# Patient Record
Sex: Female | Born: 1995 | Race: White | Hispanic: No | Marital: Married | Smoking: Former smoker
Health system: Southern US, Community
[De-identification: ages and names within clinical notes are randomized; demographics above are authoritative.]

## PROBLEM LIST (undated history)

## (undated) DIAGNOSIS — IMO0002 Reserved for concepts with insufficient information to code with codable children: Secondary | ICD-10-CM

## (undated) DIAGNOSIS — F32A Depression, unspecified: Secondary | ICD-10-CM

## (undated) DIAGNOSIS — Z87898 Personal history of other specified conditions: Secondary | ICD-10-CM

## (undated) DIAGNOSIS — F319 Bipolar disorder, unspecified: Secondary | ICD-10-CM

## (undated) DIAGNOSIS — F4310 Post-traumatic stress disorder, unspecified: Secondary | ICD-10-CM

## (undated) DIAGNOSIS — Z915 Personal history of self-harm: Secondary | ICD-10-CM

## (undated) DIAGNOSIS — F419 Anxiety disorder, unspecified: Secondary | ICD-10-CM

## (undated) DIAGNOSIS — T7421XA Adult sexual abuse, confirmed, initial encounter: Secondary | ICD-10-CM

## (undated) DIAGNOSIS — F329 Major depressive disorder, single episode, unspecified: Secondary | ICD-10-CM

## (undated) HISTORY — DX: Anxiety disorder, unspecified: F41.9

## (undated) HISTORY — DX: Major depressive disorder, single episode, unspecified: F32.9

## (undated) HISTORY — DX: Bipolar disorder, unspecified: F31.9

## (undated) HISTORY — DX: Adult sexual abuse, confirmed, initial encounter: T74.21XA

## (undated) HISTORY — DX: Personal history of self-harm: Z91.5

## (undated) HISTORY — DX: Post-traumatic stress disorder, unspecified: F43.10

## (undated) HISTORY — DX: Personal history of other specified conditions: Z87.898

## (undated) HISTORY — DX: Reserved for concepts with insufficient information to code with codable children: IMO0002

## (undated) HISTORY — PX: ANKLE SURGERY: SHX546

## (undated) HISTORY — DX: Depression, unspecified: F32.A

---

## 2015-09-06 NOTE — L&D Delivery Note (Signed)
Delivery Note  Around 1515, FHR began having prolonged variable decels down to 70's-80's with return to baseline 120's.  Anterior lip of cervix reduced by E. Foley, RN and vtx was at +2 station.  Pt pushed effectively several times.  However, the FHR dropped to the 70's -80's without returning to baseline. Vtx at +3 station at this point.  I recommended a Vacuum and pt and family agreed. Risk of hematoma discussed.  Kiwi applied, had one pop-off and baby delivered with the next push at 1523. There was not a nuchal cord.  Weight 5lb 14oz, Cord ph 7.1 venous.  Baby was initially vigorous with good cry, fair tone.  After 1 minute, the cord was clamped and cut. 40 units of pitocin diluted in 1000cc LR was infused rapidly IV.  The placenta separated spontaneously and delivered via CCT and maternal pushing effort.  It was inspected and appears to be intact with a 3 VC. Baby became floppy at this point and was taken to the warmen.  Code Apgar called and NICU took care of the baby.    Anesthesia:  epidural Episiotomy: None Lacerations: 2nd degree, bilateral sulcus tears:  Repaired by Dr. Emelda FearFerguson Suture Repair: 2.0 vicryl Est. Blood Loss (mL):  600  Mom to postpartum.  Baby to Nursery.  Nguyen,Theresa Meece 07/28/2016, 4:27 PM

## 2016-01-21 LAB — CYTOLOGY - PAP: PAP SMEAR: ABNORMAL — AB

## 2016-02-02 LAB — OB RESULTS CONSOLE HGB/HCT, BLOOD
HEMATOCRIT: 41 %
HEMOGLOBIN: 13.6 g/dL

## 2016-02-02 LAB — OB RESULTS CONSOLE RPR: RPR: NONREACTIVE

## 2016-02-02 LAB — OB RESULTS CONSOLE ABO/RH: RH Type: POSITIVE

## 2016-02-02 LAB — OB RESULTS CONSOLE VARICELLA ZOSTER ANTIBODY, IGG: Varicella: IMMUNE

## 2016-02-02 LAB — OB RESULTS CONSOLE HIV ANTIBODY (ROUTINE TESTING): HIV: NONREACTIVE

## 2016-02-02 LAB — OB RESULTS CONSOLE PLATELET COUNT: Platelets: 250 10*3/uL

## 2016-02-02 LAB — OB RESULTS CONSOLE GC/CHLAMYDIA
CHLAMYDIA, DNA PROBE: POSITIVE
GC PROBE AMP, GENITAL: NEGATIVE

## 2016-02-02 LAB — OB RESULTS CONSOLE RUBELLA ANTIBODY, IGM: Rubella: NON-IMMUNE/NOT IMMUNE

## 2016-02-02 LAB — OB RESULTS CONSOLE HEPATITIS B SURFACE ANTIGEN: HEP B S AG: NEGATIVE

## 2016-02-02 LAB — OB RESULTS CONSOLE ANTIBODY SCREEN: ANTIBODY SCREEN: NEGATIVE

## 2016-06-01 ENCOUNTER — Encounter (HOSPITAL_COMMUNITY): Payer: Self-pay

## 2016-06-01 ENCOUNTER — Emergency Department (HOSPITAL_COMMUNITY)
Admission: EM | Admit: 2016-06-01 | Discharge: 2016-06-01 | Discharge status: Against Medical Advice | Payer: Medicaid Other | Attending: Emergency Medicine | Admitting: Emergency Medicine

## 2016-06-01 DIAGNOSIS — O26893 Other specified pregnancy related conditions, third trimester: Secondary | ICD-10-CM | POA: Diagnosis present

## 2016-06-01 DIAGNOSIS — R109 Unspecified abdominal pain: Secondary | ICD-10-CM | POA: Diagnosis not present

## 2016-06-01 DIAGNOSIS — O99333 Smoking (tobacco) complicating pregnancy, third trimester: Secondary | ICD-10-CM | POA: Diagnosis not present

## 2016-06-01 DIAGNOSIS — Z3A29 29 weeks gestation of pregnancy: Secondary | ICD-10-CM | POA: Insufficient documentation

## 2016-06-01 DIAGNOSIS — F17200 Nicotine dependence, unspecified, uncomplicated: Secondary | ICD-10-CM | POA: Insufficient documentation

## 2016-06-01 DIAGNOSIS — Z3492 Encounter for supervision of normal pregnancy, unspecified, second trimester: Secondary | ICD-10-CM

## 2016-06-01 LAB — URINALYSIS, ROUTINE W REFLEX MICROSCOPIC
BILIRUBIN URINE: NEGATIVE
GLUCOSE, UA: 100 mg/dL — AB
HGB URINE DIPSTICK: NEGATIVE
Leukocytes, UA: NEGATIVE
Nitrite: NEGATIVE
PROTEIN: NEGATIVE mg/dL
Specific Gravity, Urine: 1.02 (ref 1.005–1.030)
pH: 6.5 (ref 5.0–8.0)

## 2016-06-01 MED ORDER — GNP PRENATAL VITAMINS 28-0.8 MG PO TABS: 1.0000 | ORAL_TABLET | Freq: Every day | ORAL | 0 refills | Status: DC

## 2016-06-01 MED ORDER — SODIUM CHLORIDE 0.9 % IV BOLUS (SEPSIS): 1000.0000 mL | Freq: Once | INTRAVENOUS | Status: DC

## 2016-06-01 NOTE — ED Notes (Signed)
Pt given oral fluids and is tolerating well

## 2016-06-01 NOTE — ED Notes (Signed)
Pt awake and alert.  Talking and laughing on cell phone.

## 2016-06-01 NOTE — ED Provider Notes (Signed)
[facility] DEPT Provider Note   CSN: [id number] Arrival date & time: 06/01/16  1937     History   Chief Complaint Chief Complaint  Patient presents with  . Abdominal Pain    HPI Theresa Nguyen is a 20 y.o. female.  Pt is approximately [redacted] weeks pregnant and presents to the ED with abdominal pain and cramping and a fluid leak around 1630.  The pt had to move mid-pregnancy and has not had any prenatal care since July.  The pt said the abdominal cramping has increased since the fluid leaked.  Pt denies any vaginal bleeding.      History reviewed. No pertinent past medical history.  There are no active problems to display for this patient.   Past Surgical History:  Procedure Laterality Date  . ANKLE SURGERY      OB History    Gravida Para Term Preterm AB Living   1             SAB TAB Ectopic Multiple Live Births                   Home Medications    Prior to Admission medications   Medication Sig Start Date End Date Taking? Authorizing Provider  Prenatal Vit-Fe Fumarate-FA (GNP PRENATAL VITAMINS) 28-0.8 MG TABS Take 1 tablet by mouth daily. 06/01/16   Jacalyn LefevreJulie Arihanna Estabrook, MD    Family History No family history on file.  Social History Social History  Substance Use Topics  . Smoking status: Current Every Day Smoker    Packs/day: 0.50  . Smokeless tobacco: Never Used  . Alcohol use No     Allergies   Review of patient's allergies indicates no known allergies.   Review of Systems Review of Systems  Gastrointestinal: Positive for abdominal pain.  All other systems reviewed and are negative.    Physical Exam Updated Vital Signs BP 122/84   Pulse 102   Temp 98.9 F (37.2 C) (Oral)   Resp 24   Ht 5\' 2"  (1.575 m)   Wt 164 lb 8 oz (74.6 kg)   SpO2 100%   BMI 30.09 kg/m   Physical Exam  Constitutional: She appears well-developed and well-nourished.  HENT:  Head: Normocephalic and atraumatic.  Right Ear: External ear normal.  Left Ear: External  ear normal.  Nose: Nose normal.  Mouth/Throat: Oropharynx is clear and moist.  Eyes: Conjunctivae and EOM are normal. Pupils are equal, round, and reactive to light.  Neck: Normal range of motion. Neck supple.  Cardiovascular: Normal rate, regular rhythm, normal heart sounds and intact distal pulses.   Pulmonary/Chest: Effort normal and breath sounds normal.  Abdominal: Soft. Bowel sounds are normal.  Pt is gravid  Musculoskeletal: Normal range of motion.  Neurological: She is alert.  Skin: Skin is warm.  Psychiatric: She has a normal mood and affect. Her behavior is normal. Judgment and thought content normal.  Nursing note and vitals reviewed.    ED Treatments / Results  Labs (all labs ordered are listed, but only abnormal results are displayed) Labs Reviewed  URINALYSIS, ROUTINE W REFLEX MICROSCOPIC (NOT AT [facility])    EKG  EKG Interpretation None       Radiology No results found.  Procedures Procedures (including critical care time)  Medications Ordered in ED Medications - No data to display   Initial Impression / Assessment and Plan / ED Course  I have reviewed the triage vital signs and the nursing notes.  Pertinent labs & imaging  results that were available during my care of the patient were reviewed by me and considered in my medical decision making (see chart for details).  Clinical Course   Nitrazine paper test pH of 5.  Rapid OB response called and pt hooked to the fetal monitor.  The OB folks said the tracings were reassuring and pt was not in labor.  Pt is feeling better.  She is able to tolerate po fluids.  Final Clinical Impressions(s) / ED Diagnoses   Final diagnoses:  Second trimester pregnancy    New Prescriptions New Prescriptions   PRENATAL VIT-FE FUMARATE-FA (GNP PRENATAL VITAMINS) 28-0.8 MG TABS    Take 1 tablet by mouth daily.     Jacalyn Lefevre, MD 06/01/16 2215

## 2016-06-01 NOTE — ED Notes (Addendum)
Pt observed walking down the hallway fully dressed.  Pt states she has to leave that her friend with her has to be at work in the morning at 0300.  Pt refused to wait for discharge papers.  Ed md notified

## 2016-06-01 NOTE — ED Provider Notes (Signed)
.   Please see previous physicians note regarding patient's presenting history and physical, initial ED course, and associated medical decision making. Pending UA at time of sign out. UA is negative for bacteria or infection. Cleared from contractions by OB earlier. Was felt to be stable for discharge if UA unermarkable. Patient eloped prior to resulting of UA.     Theresa Nguyen Arthor Gorter, MD 06/01/16 [id number]

## 2016-06-01 NOTE — ED Notes (Addendum)
[facility]  Pt removed from monitor at this time

## 2016-06-01 NOTE — ED Triage Notes (Signed)
Reports of abdominal cramping that started last night. States she was sitting on commode approx. 1630 and had gush of fluid leak. Patient states cramping in stomach has increased since fluid leaking. Approx. [redacted] weeks pregnant.

## 2016-06-01 NOTE — ED Notes (Signed)
[facility], she states baby and mother look good on the monitor.  Informed ed md

## 2016-06-17 ENCOUNTER — Other Ambulatory Visit (HOSPITAL_COMMUNITY)
Admission: RE | Admit: 2016-06-17 | Discharge: 2016-06-17 | Disposition: A | Discharge status: Home / Self Care | Payer: Medicaid Other | Source: Ambulatory Visit | Attending: Obstetrics and Gynecology | Admitting: Obstetrics and Gynecology

## 2016-06-17 ENCOUNTER — Encounter: Payer: Self-pay | Admitting: Obstetrics and Gynecology

## 2016-06-17 ENCOUNTER — Encounter: Payer: Self-pay | Admitting: *Deleted

## 2016-06-17 ENCOUNTER — Ambulatory Visit (INDEPENDENT_AMBULATORY_CARE_PROVIDER_SITE_OTHER): Payer: Medicaid Other | Admitting: Obstetrics and Gynecology

## 2016-06-17 VITALS — BP 118/87 | HR 96 | Wt 162.0 lb

## 2016-06-17 DIAGNOSIS — F319 Bipolar disorder, unspecified: Secondary | ICD-10-CM | POA: Insufficient documentation

## 2016-06-17 DIAGNOSIS — Z8619 Personal history of other infectious and parasitic diseases: Secondary | ICD-10-CM | POA: Insufficient documentation

## 2016-06-17 DIAGNOSIS — R87610 Atypical squamous cells of undetermined significance on cytologic smear of cervix (ASC-US): Secondary | ICD-10-CM | POA: Insufficient documentation

## 2016-06-17 DIAGNOSIS — Q249 Congenital malformation of heart, unspecified: Secondary | ICD-10-CM | POA: Insufficient documentation

## 2016-06-17 DIAGNOSIS — O0993 Supervision of high risk pregnancy, unspecified, third trimester: Secondary | ICD-10-CM

## 2016-06-17 DIAGNOSIS — O0990 Supervision of high risk pregnancy, unspecified, unspecified trimester: Secondary | ICD-10-CM | POA: Insufficient documentation

## 2016-06-17 DIAGNOSIS — Z113 Encounter for screening for infections with a predominantly sexual mode of transmission: Secondary | ICD-10-CM

## 2016-06-17 DIAGNOSIS — Z87440 Personal history of urinary (tract) infections: Secondary | ICD-10-CM | POA: Insufficient documentation

## 2016-06-17 DIAGNOSIS — F3189 Other bipolar disorder: Secondary | ICD-10-CM

## 2016-06-17 DIAGNOSIS — Z23 Encounter for immunization: Secondary | ICD-10-CM

## 2016-06-17 DIAGNOSIS — F329 Major depressive disorder, single episode, unspecified: Secondary | ICD-10-CM

## 2016-06-17 DIAGNOSIS — O99343 Other mental disorders complicating pregnancy, third trimester: Secondary | ICD-10-CM

## 2016-06-17 DIAGNOSIS — IMO0002 Reserved for concepts with insufficient information to code with codable children: Secondary | ICD-10-CM | POA: Insufficient documentation

## 2016-06-17 DIAGNOSIS — Z915 Personal history of self-harm: Secondary | ICD-10-CM

## 2016-06-17 DIAGNOSIS — T7421XS Adult sexual abuse, confirmed, sequela: Secondary | ICD-10-CM

## 2016-06-17 DIAGNOSIS — Z87898 Personal history of other specified conditions: Secondary | ICD-10-CM | POA: Insufficient documentation

## 2016-06-17 DIAGNOSIS — F32A Depression, unspecified: Secondary | ICD-10-CM

## 2016-06-17 DIAGNOSIS — F419 Anxiety disorder, unspecified: Secondary | ICD-10-CM

## 2016-06-17 DIAGNOSIS — R87810 Cervical high risk human papillomavirus (HPV) DNA test positive: Secondary | ICD-10-CM

## 2016-06-17 DIAGNOSIS — T7421XA Adult sexual abuse, confirmed, initial encounter: Secondary | ICD-10-CM | POA: Insufficient documentation

## 2016-06-17 LAB — TETRALOGY OF FALLOT REPAIR: AFP: NEGATIVE
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
## 2016-06-18 LAB — PAIN MGMT, PROFILE 6 CONF W/O MM, U
6 ACETYLMORPHINE: NEGATIVE ng/mL (ref <10)
AMPHETAMINES: NEGATIVE ng/mL (ref <500)
Alcohol Metabolites: NEGATIVE ng/mL (ref <500)
BARBITURATES: NEGATIVE ng/mL (ref <300)
Benzodiazepines: NEGATIVE ng/mL (ref <100)
CREATININE: 125.7 mg/dL (ref >=20.0)
Cocaine Metabolite: NEGATIVE ng/mL (ref <150)
Marijuana Metabolite: NEGATIVE ng/mL (ref <20)
Methadone Metabolite: NEGATIVE ng/mL (ref <100)
Opiates: NEGATIVE ng/mL (ref <100)
Oxidant: NEGATIVE ug/mL (ref <200)
Oxycodone: NEGATIVE ng/mL (ref <100)
PH: 7.52 (ref 4.5–9.0)
Phencyclidine: NEGATIVE ng/mL (ref <25)
Please note:: 0

## 2016-06-18 LAB — CBC
HCT: 37 % (ref 35.0–45.0)
Hemoglobin: 12 g/dL (ref 11.7–15.5)
MCH: 27.8 pg (ref 27.0–33.0)
MCHC: 32.4 g/dL (ref 32.0–36.0)
MCV: 85.8 fL (ref 80.0–100.0)
MPV: 10.7 fL (ref 7.5–12.5)
Platelets: 251 10*3/uL (ref 140–400)
RBC: 4.31 MIL/uL (ref 3.80–5.10)
RDW: 12.3 % (ref 11.0–15.0)
WBC: 11.4 10*3/uL — AB (ref 3.8–10.8)

## 2016-06-18 LAB — RPR

## 2016-06-18 LAB — HEPATITIS C ANTIBODY: HCV AB: NEGATIVE

## 2016-06-18 LAB — GLUCOSE TOLERANCE, 1 HOUR (50G) W/O FASTING: GLUCOSE, 1 HR, GESTATIONAL: 82 mg/dL (ref <140)

## 2016-06-18 LAB — HIV ANTIBODY (ROUTINE TESTING W REFLEX): HIV 1&2 Ab, 4th Generation: NONREACTIVE

## 2016-06-19 LAB — URINE CULTURE

## 2016-06-20 ENCOUNTER — Encounter: Payer: Self-pay | Admitting: *Deleted

## 2016-06-20 LAB — GC/CHLAMYDIA PROBE AMP (~~LOC~~) NOT AT ARMC
CHLAMYDIA, DNA PROBE: NEGATIVE
NEISSERIA GONORRHEA: NEGATIVE

## 2016-06-21 ENCOUNTER — Telehealth: Payer: Self-pay | Admitting: *Deleted

## 2016-06-21 NOTE — Telephone Encounter (Signed)
-----   Message from Olevia BowensJacinda S Battle sent at 06/21/2016 10:17 AM EDT ----- Regarding: Preg Care Manager [facility] In[facility]anika Scales  Pregnancy Care Manager [phone] wants to speak with you regarding this patient

## 2016-06-21 NOTE — Telephone Encounter (Signed)
Returned call, left message. 

## 2016-06-22 ENCOUNTER — Encounter (HOSPITAL_COMMUNITY): Payer: Self-pay | Admitting: Obstetrics and Gynecology

## 2016-07-01 ENCOUNTER — Ambulatory Visit (HOSPITAL_COMMUNITY): Payer: Medicaid Other

## 2016-07-01 ENCOUNTER — Encounter: Payer: Medicaid Other | Admitting: Obstetrics and Gynecology

## 2016-07-08 ENCOUNTER — Ambulatory Visit (HOSPITAL_COMMUNITY)
Admission: RE | Admit: 2016-07-08 | Discharge: 2016-07-08 | Disposition: A | Discharge status: Home / Self Care | Payer: Medicaid Other | Source: Ambulatory Visit | Attending: Obstetrics and Gynecology | Admitting: Obstetrics and Gynecology

## 2016-07-08 DIAGNOSIS — O0933 Supervision of pregnancy with insufficient antenatal care, third trimester: Secondary | ICD-10-CM | POA: Diagnosis not present

## 2016-07-08 DIAGNOSIS — O0993 Supervision of high risk pregnancy, unspecified, third trimester: Secondary | ICD-10-CM

## 2016-07-08 DIAGNOSIS — Z363 Encounter for antenatal screening for malformations: Secondary | ICD-10-CM | POA: Insufficient documentation

## 2016-07-08 DIAGNOSIS — Z3A34 34 weeks gestation of pregnancy: Secondary | ICD-10-CM | POA: Diagnosis not present

## 2016-07-08 DIAGNOSIS — Z87898 Personal history of other specified conditions: Secondary | ICD-10-CM

## 2016-07-15 ENCOUNTER — Encounter: Payer: Medicaid Other | Admitting: Family Medicine

## 2016-07-20 ENCOUNTER — Other Ambulatory Visit (HOSPITAL_COMMUNITY)
Admission: RE | Admit: 2016-07-20 | Discharge: 2016-07-20 | Disposition: A | Discharge status: Home / Self Care | Payer: Medicaid Other | Source: Ambulatory Visit | Attending: Family Medicine | Admitting: Family Medicine

## 2016-07-20 ENCOUNTER — Ambulatory Visit (INDEPENDENT_AMBULATORY_CARE_PROVIDER_SITE_OTHER): Payer: Medicaid Other | Admitting: Family Medicine

## 2016-07-20 VITALS — BP 142/97 | Wt 168.0 lb

## 2016-07-20 DIAGNOSIS — R87810 Cervical high risk human papillomavirus (HPV) DNA test positive: Secondary | ICD-10-CM

## 2016-07-20 DIAGNOSIS — F3189 Other bipolar disorder: Secondary | ICD-10-CM | POA: Diagnosis not present

## 2016-07-20 DIAGNOSIS — O133 Gestational [pregnancy-induced] hypertension without significant proteinuria, third trimester: Secondary | ICD-10-CM

## 2016-07-20 DIAGNOSIS — R87610 Atypical squamous cells of undetermined significance on cytologic smear of cervix (ASC-US): Secondary | ICD-10-CM

## 2016-07-20 DIAGNOSIS — O0993 Supervision of high risk pregnancy, unspecified, third trimester: Secondary | ICD-10-CM

## 2016-07-20 DIAGNOSIS — O99343 Other mental disorders complicating pregnancy, third trimester: Secondary | ICD-10-CM

## 2016-07-20 DIAGNOSIS — F329 Major depressive disorder, single episode, unspecified: Secondary | ICD-10-CM | POA: Diagnosis not present

## 2016-07-20 DIAGNOSIS — O163 Unspecified maternal hypertension, third trimester: Secondary | ICD-10-CM

## 2016-07-20 DIAGNOSIS — Z113 Encounter for screening for infections with a predominantly sexual mode of transmission: Secondary | ICD-10-CM | POA: Diagnosis not present

## 2016-07-20 DIAGNOSIS — Q249 Congenital malformation of heart, unspecified: Secondary | ICD-10-CM

## 2016-07-20 DIAGNOSIS — F319 Bipolar disorder, unspecified: Secondary | ICD-10-CM

## 2016-07-20 DIAGNOSIS — F32A Depression, unspecified: Secondary | ICD-10-CM

## 2016-07-20 LAB — COMPREHENSIVE METABOLIC PANEL
ALT: 7 U/L (ref 6–29)
AST: 13 U/L (ref 10–30)
Albumin: 3.5 g/dL — ABNORMAL LOW (ref 3.6–5.1)
Alkaline Phosphatase: 148 U/L — ABNORMAL HIGH (ref 33–115)
BUN: 7 mg/dL (ref 7–25)
CALCIUM: 8.8 mg/dL (ref 8.6–10.2)
CHLORIDE: 105 mmol/L (ref 98–110)
CO2: 19 mmol/L — AB (ref 20–31)
Creat: 0.47 mg/dL — ABNORMAL LOW (ref 0.50–1.10)
GLUCOSE: 97 mg/dL (ref 65–99)
POTASSIUM: 4.2 mmol/L (ref 3.5–5.3)
Sodium: 135 mmol/L (ref 135–146)
Total Bilirubin: 0.3 mg/dL (ref 0.2–1.2)
Total Protein: 6 g/dL — ABNORMAL LOW (ref 6.1–8.1)

## 2016-07-20 LAB — CBC
HCT: 35.4 % (ref 35.0–45.0)
HEMOGLOBIN: 11.8 g/dL (ref 11.7–15.5)
MCH: 26.9 pg — AB (ref 27.0–33.0)
MCHC: 33.3 g/dL (ref 32.0–36.0)
MCV: 80.6 fL (ref 80.0–100.0)
MPV: 11.7 fL (ref 7.5–12.5)
Platelets: 229 10*3/uL (ref 140–400)
RBC: 4.39 MIL/uL (ref 3.80–5.10)
RDW: 12.8 % (ref 11.0–15.0)
WBC: 9 10*3/uL (ref 3.8–10.8)

## 2016-07-20 LAB — OB RESULTS CONSOLE GC/CHLAMYDIA: Gonorrhea: NEGATIVE

## 2016-07-20 LAB — OB RESULTS CONSOLE GBS: GBS: NEGATIVE

## 2016-07-20 MED ORDER — SERTRALINE HCL 50 MG PO TABS: 50.0000 mg | ORAL_TABLET | Freq: Every day | ORAL | 2 refills | Status: DC

## 2016-07-20 NOTE — Patient Instructions (Signed)
Exclusive Breastfeeding Deciding to breastfeed is one of the best choices you can make for you and your baby. Breast milk has all the nutrients that your baby needs, when your baby needs them. That is why many women who can choose to breastfeed exclusively. A change in hormones during pregnancy causes your breast tissue to grow and increases the number and size of your milk ducts. These hormones also allow proteins, sugars, and fats from your blood supply to make breast milk in your milk-producing glands. Hormones prevent breast milk from being released before your baby is born as well as prompt milk flow after birth. Once breastfeeding has begun, thoughts of your baby, as well as his or her sucking or crying, can stimulate the release of milk from your milk-producing glands.  BENEFITS OF BREASTFEEDING For Your Baby   Your first milk (colostrum) helps your baby's digestive system function better.  There are antibodies in your milk that help your baby fight off infections.  Your baby has a lower incidence of asthma, allergies, and sudden infant death syndrome.  The nutrients in breast milk are better for your baby than infant formulas and are designed uniquely for your baby's needs.  Breast milk improves your baby's brain development.  Your baby is less likely to develop other conditions, such as childhood obesity, asthma, or type 2 diabetes mellitus. For You   Breastfeeding helps to create a very special bond between you and your baby.  Breastfeeding is convenient. Breast milk is always available at the correct temperature and costs nothing.  Breastfeeding helps to burn calories and helps you lose the weight gained during pregnancy.  Breastfeeding makes your uterus contract to its prepregnancy size faster and slows bleeding (lochia) after you give birth.  Breastfeeding helps to lower your risk of developing type 2 diabetes mellitus, osteoporosis, and breast or ovarian cancer later in  life. BENEFITS OF EXCLUSIVE BREASTFEEDING  The more often your baby breastfeeds, the more milk you will produce. By exclusively breastfeeding, you help to ensure that your baby has an ample supply of milk. Supplementing with water or infant formula when your baby would otherwise be breastfeeding may send a message to your body that less milk is needed. Your baby may become full from the supplements and breastfeed less. This can interfere with the establishment of your milk supply.  Exclusive breastfeeding helps to develop your baby's immune system in a healthy way. Your breast milk contains antibodies your baby needs to to fight off germs that cause infections. Breastfed babies are also less likely to develop food allergies. When your baby is introduced to foods other than breast milk too soon, his or her body is more likely to interpret that food as an allergen, making antibodies against it. This is what causes food allergies. If your baby breastfeeds less because he or she is also receiving supplements, he or she may have a harder time fighting infections or be more likely to develop food allergies. SIGNS THAT YOUR BABY IS HUNGRY Early Signs of Hunger  Increased alertness or activity.  Stretching.  Movement of the head from side to side.  Movement of the head and opening of the mouth when the corner of the mouth or cheek is stroked (rooting).  Increased sucking sounds, smacking lips, cooing, sighing, or squeaking.  Hand-to-mouth movements.  Increased sucking of fingers or hands. Late Signs of Hunger   Fussing.  Intermittent crying. Extreme Signs of Hunger  Signs of extreme hunger will require calming and  consoling before your baby will be able to breastfeed successfully. Do not wait for the following signs of extreme hunger to occur before you initiate breastfeeding:   Restlessness.  A loud, strong cry.  Screaming. BREASTFEEDING BASICS Breastfeeding Initiation   Find a  comfortable place to sit or lie down, with your neck and back well supported.  Place a pillow or rolled up blanket under your baby to bring him or her to the level of your breast (if you are seated). Nursing pillows are specially designed to help support your arms and your baby while you breastfeed.  Make sure that your baby's abdomen is facing your abdomen.  Gently massage your breast. With your fingertips, massage from your chest wall toward your nipple in a circular motion. This encourages milk flow. You may need to continue this action during the feeding if your milk flows slowly.  Support your breast with 4 fingers underneath and your thumb above your nipple. Make sure your fingers are well away from your nipple and your baby's mouth.  Stroke your baby's lips gently with your finger or nipple.  When your baby's mouth is open wide enough, quickly bring your baby to your breast, placing your entire nipple and as much of the colored area around your nipple (areola) as possible into your baby's mouth.  More areola should be visible above your baby's upper lip than below the lower lip.  Your baby's tongue should be between his or her lower gum and your breast.  Ensure that your baby's mouth is correctly positioned around your nipple (latched). Your baby's lips should create a seal on your breast and be turned out (everted).  It is common for your baby to suck for about 2-3 minutes in order to start the flow of breast milk. Latching  Teaching your baby how to latch on to your breast properly is very important. An improper latch can cause nipple pain and decreased milk supply for you and poor weight gain in your baby. Also, if your baby is not latched onto your nipple properly, he or she may swallow some air during feeding. This can make your baby fussy. Burping your baby when you switch breasts during the feeding can help to get rid of the air. However, teaching your baby to latch on properly is  still the best way to prevent your baby from swallowing air while breastfeeding. Signs that your baby has successfully latched on to your nipple:   Silent tugging or silent sucking, without causing you pain.  Swallowing heard between every 3-4 sucks.  Muscle movement above and in front of his or her ears while sucking. Signs that your baby has not successfully latched on to your nipple:   Sucking sounds or smacking sounds from your baby while breastfeeding.  Nipple pain. If you think your baby has not latched on correctly, slip your finger into the corner of your baby's mouth to break the suction and place it between your baby's gums. Attempt breastfeeding initiation again. Signs of Successful Breastfeeding  Signs from your baby:   A gradual decrease in the number of sucks or complete cessation of sucking.  Falling asleep.  Relaxation of his or her body.  Retention of a small amount of milk in his or her mouth.  Letting go of your breast by himself or herself. Signs from you:  Breasts that have increased in firmness, weight, and size 1-3 hours after feeding.  Breasts that are softer immediately after breastfeeding.  Increased  milk volume, as well as a change in milk consistency and color by the 5th day of breastfeeding.  Nipples that are not sore, cracked, or bleeding. Signs That Your Pecola LeisureBaby is Getting Enough Milk   Wetting at least 3 diapers in a 24-hour period. The urine should be clear and pale yellow by age 37 days.  At least 3 stools in a 24-hour period by age 37 days. The stool should be soft and yellow.  At least 3 stools in a 24-hour period by age 68 days. The stool should be seedy and yellow.  No loss of weight greater than 10% of birth weight during the first 483 days of age.  Average weight gain of 4-7 ounces (120-210 mL) per week after age 75 days.  Consistent daily weight gain by age 37 days, without weight loss after the age of 2 weeks. After a feeding, your baby  may spit up a small amount. This is common. BREASTFEEDING FREQUENCY AND DURATION Frequent feeding will help you make more milk and can prevent sore nipples and breast engorgement. Breastfeed when you feel the need to reduce the fullness of your breasts or when your baby shows signs of hunger. This is called "breastfeeding on demand." Avoid introducing a pacifier to your baby while you are working to establish breastfeeding (the first 4-6 weeks after your baby is born). After this time you may choose to use a pacifier. Research has shown that pacifier use during the first year of a baby's life decreases the risk of sudden infant death syndrome (SIDS). Allow your baby to feed on each breast as long as he or she wants. Breastfeed until your baby is finished feeding. When your baby unlatches or falls asleep while feeding from the first breast, offer the second breast. Because newborns are often sleepy in the first few weeks of life, you may need to awaken your baby to get him or her to feed. Breastfeeding times will vary from baby to baby. However, the following rules can serve as a guide to help you ensure that your baby is properly fed:  Newborns (babies 84 weeks of age or younger) may breastfeed every 1-3 hours.  Newborns should not go longer than 3 hours during the day or 5 hours during the night without breastfeeding.  You should breastfeed your baby a minimum of 8 times in a 24-hour period until you begin to introduce solid foods to your baby at around 746 months of age. BREAST MILK PUMPING Pumping and storing breast milk allows you to ensure that your baby is exclusively fed your breast milk, even at times when you are unable to breastfeed. This is especially important if you are going back to work while you are still breastfeeding or when you are not able to be present during feedings. Your lactation consultant can give you guidelines on how long it is safe to store breast milk.  A breast pump is a  machine that allows you to pump milk from your breast into a sterile bottle. The pumped breast milk can then be stored in a refrigerator or freezer. Some breast pumps are operated by hand, while others use electricity. Ask your lactation consultant which type will work best for you. Breast pumps can be purchased, but some hospitals and breastfeeding support groups lease breast pumps on a monthly basis. A lactation consultant can teach you how to hand express breast milk, if you prefer not to use a pump.  CARING FOR YOUR BREASTS WHILE YOU  BREASTFEED Nipples can become dry, cracked, and sore while breastfeeding. The following recommendations can help keep your breasts moisturized and healthy:  Avoid using soap on your nipples.  Wear a supportive bra. Although not required, special nursing bras and tank tops are designed to allow access to your breasts for breastfeeding without taking off your entire bra or top. Avoid wearing underwire style bras or extremely tight bras.  Air dry your nipples for 3-4 minutes after each feeding.  Use only cotton bra pads to absorb leaked breast milk. Leaking of breast milk between feedings is normal.  Use lanolin on your nipples after breastfeeding. Lanolin helps to maintain your skin's normal moisture barrier. If you use pure lanolin you do not need to wash it off before feeding your baby again. Pure lanolin is not toxic to your baby. You may also hand express a few drops of breast milk and gently massage that milk into your nipples and allow the milk to air dry. In the first few weeks after giving birth, some women experience extremely full breasts (engorgement). Engorgement can make your breasts feel heavy, warm, and tender to the touch. Engorgement peaks within 3-5 days after you give birth. The following recommendations can help ease engorgement:  Completely empty your breasts while breastfeeding or pumping. You may want to start by applying warm, moist heat (in the  shower or with warm water-soaked hand towels) just before feeding or pumping. This increases circulation and helps the milk flow. If your baby does not completely empty your breasts while breastfeeding, pump any extra milk after he or she is finished.  Wear a snug bra (nursing or regular) or tank top for 1-2 days to signal your body to slightly decrease milk production.  Apply ice packs to your breasts, unless this is too uncomfortable for you.  Make sure that your baby is latched on and positioned properly while breastfeeding. If engorgement persists after 48 hours of following these recommendations, contact your health care provider or a Advertising copywriter. OVERALL HEALTH CARE RECOMMENDATIONS WHILE BREASTFEEDING   Eat healthy foods. Alternate between meals and snacks, eating 3 of each per day. Because what you eat affects your breast milk, some of the foods may make your baby more irritable than usual. Avoid eating these foods if you are sure that they are negatively affecting your baby.  Drink milk, fruit juice, and water to satisfy your thirst (about 10 glasses a day).  Rest often, relax, and continue to take your prenatal vitamins to prevent fatigue, stress, and anemia.  Continue breast self-awareness checks.  Avoid chewing and smoking tobacco.  Avoid alcohol and drug use. Some medicines that may be harmful to your baby can pass through breast milk. It is important to ask your health care provider before taking any medicine, including all over-the-counter and prescription medicine as well as vitamin and herbal supplements. It is possible to become pregnant while breastfeeding. If birth control is desired, ask your health care provider about options that will be safe for your baby. SEEK MEDICAL CARE IF:   You feel like you want to stop breastfeeding or have become frustrated with breastfeeding.  You have painful breasts or nipples.  Your nipples are cracked or bleeding.  Your  breasts are red, tender, or warm.  You have a swollen area on either breast.  You have a fever or chills.  You have nausea or vomiting.  You have drainage other than breast milk from your nipples.  Your breasts do not  become full before feedings by the 5th day after you give birth.  You feel sad and depressed.  Your baby is too sleepy to eat well.  Your baby is having trouble sleeping.  Your baby is wetting less than 3 diapers in a 24-hour period.  Your baby has less than 3 stools in a 24-hour period.  Your baby's skin or the white part of his or her eyes becomes yellow.  Your baby is not gaining weight by 22 days of age. SEEK IMMEDIATE MEDICAL CARE IF:   Your baby is overly tired (lethargic) and does not want to wake up and feed.  Your baby develops an unexplained fever. This information is not intended to replace advice given to you by your health care provider. Make sure you discuss any questions you have with your health care provider. Document Released: 06/18/2009 Document Revised: 12/14/2015 Document Reviewed: 02/14/2013 Elsevier Interactive Patient Education  2017 [employer].
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
## 2016-07-20 NOTE — Progress Notes (Signed)
Pt states about a week ago she has been leaking fluid and has increased pain with irregular contractions. She states she has started taking Zoloft that was given to her by previous OB because depression is worsening and hears voices in her head. She states she needs a refill today on Zoloft.

## 2016-07-21 ENCOUNTER — Ambulatory Visit: Payer: Medicaid Other | Admitting: *Deleted

## 2016-07-21 ENCOUNTER — Encounter (HOSPITAL_COMMUNITY): Payer: Self-pay | Admitting: *Deleted

## 2016-07-21 ENCOUNTER — Telehealth (HOSPITAL_COMMUNITY): Payer: Self-pay | Admitting: *Deleted

## 2016-07-21 ENCOUNTER — Encounter: Payer: Self-pay | Admitting: *Deleted

## 2016-07-21 VITALS — BP 144/91 | HR 94

## 2016-07-21 DIAGNOSIS — O0993 Supervision of high risk pregnancy, unspecified, third trimester: Secondary | ICD-10-CM

## 2016-07-21 LAB — PROTEIN / CREATININE RATIO, URINE
CREATININE, URINE: 88 mg/dL (ref 20–320)
Protein Creatinine Ratio: 205 mg/g creat — ABNORMAL HIGH (ref 21–161)
TOTAL PROTEIN, URINE: 18 mg/dL (ref 5–24)

## 2016-07-21 NOTE — Telephone Encounter (Signed)
Preadmission screen  

## 2016-07-21 NOTE — Progress Notes (Signed)
Pt is here for BP check only.  She was here yesterday and seen by Dr Alvester MorinNewton and who had her to return today for BP check.  Todays BP is 144/91 and per yesterday's note if continued to be elevated today she is to be scheduled for IOL @ 37 weeks.  IOL scheduled for 07/25/16 @ 7AM  Form mailed and confirmed by Herbert SetaHeather on L and D.

## 2016-07-22 LAB — CULTURE, BETA STREP (GROUP B ONLY)

## 2016-07-22 LAB — URINE CYTOLOGY ANCILLARY ONLY
Chlamydia: NEGATIVE
Neisseria Gonorrhea: NEGATIVE

## 2016-07-26 MED ORDER — FENTANYL CITRATE (PF) 100 MCG/2ML IJ SOLN: 100.0000 ug | INTRAMUSCULAR | Status: DC | PRN

## 2016-07-27 ENCOUNTER — Encounter (HOSPITAL_COMMUNITY): Payer: Self-pay

## 2016-07-27 ENCOUNTER — Inpatient Hospital Stay (HOSPITAL_COMMUNITY): Payer: Medicaid Other | Admitting: Anesthesiology

## 2016-07-27 ENCOUNTER — Inpatient Hospital Stay (HOSPITAL_COMMUNITY)
Admission: RE | Admit: 2016-07-27 | Discharge: 2016-07-30 | DRG: 775 | Disposition: A | Discharge status: Home / Self Care | Payer: Medicaid Other | Source: Ambulatory Visit | Attending: Family Medicine | Admitting: Family Medicine

## 2016-07-27 DIAGNOSIS — F319 Bipolar disorder, unspecified: Secondary | ICD-10-CM

## 2016-07-27 DIAGNOSIS — F32A Depression, unspecified: Secondary | ICD-10-CM

## 2016-07-27 DIAGNOSIS — F329 Major depressive disorder, single episode, unspecified: Secondary | ICD-10-CM

## 2016-07-27 DIAGNOSIS — Z8249 Family history of ischemic heart disease and other diseases of the circulatory system: Secondary | ICD-10-CM

## 2016-07-27 DIAGNOSIS — Z87891 Personal history of nicotine dependence: Secondary | ICD-10-CM

## 2016-07-27 DIAGNOSIS — Z3A37 37 weeks gestation of pregnancy: Secondary | ICD-10-CM | POA: Diagnosis not present

## 2016-07-27 DIAGNOSIS — O139 Gestational [pregnancy-induced] hypertension without significant proteinuria, unspecified trimester: Secondary | ICD-10-CM | POA: Diagnosis present

## 2016-07-27 DIAGNOSIS — O134 Gestational [pregnancy-induced] hypertension without significant proteinuria, complicating childbirth: Secondary | ICD-10-CM | POA: Diagnosis not present

## 2016-07-27 LAB — COMPREHENSIVE METABOLIC PANEL
ALBUMIN: 2.9 g/dL — AB (ref 3.5–5.0)
ALK PHOS: 151 U/L — AB (ref 38–126)
ALT: 11 U/L — ABNORMAL LOW (ref 14–54)
AST: 24 U/L (ref 15–41)
Anion gap: 5 (ref 5–15)
BILIRUBIN TOTAL: 0.7 mg/dL (ref 0.3–1.2)
BUN: 7 mg/dL (ref 6–20)
CALCIUM: 8.7 mg/dL — AB (ref 8.9–10.3)
CO2: 19 mmol/L — AB (ref 22–32)
Chloride: 109 mmol/L (ref 101–111)
Creatinine, Ser: 0.47 mg/dL (ref 0.44–1.00)
GFR calc Af Amer: >60 mL/min (ref >60)
GFR calc non Af Amer: >60 mL/min (ref >60)
GLUCOSE: 91 mg/dL (ref 65–99)
POTASSIUM: 4 mmol/L (ref 3.5–5.1)
SODIUM: 133 mmol/L — AB (ref 135–145)
TOTAL PROTEIN: 6.2 g/dL — AB (ref 6.5–8.1)

## 2016-07-27 LAB — RAPID URINE DRUG SCREEN, HOSP PERFORMED
Amphetamines: NOT DETECTED
BENZODIAZEPINES: NOT DETECTED
Barbiturates: NOT DETECTED
COCAINE: NOT DETECTED
Opiates: NOT DETECTED
Tetrahydrocannabinol: NOT DETECTED

## 2016-07-27 LAB — CBC
HEMATOCRIT: 31.6 % — AB (ref 36.0–46.0)
HEMOGLOBIN: 10.5 g/dL — AB (ref 12.0–15.0)
MCH: 26.6 pg (ref 26.0–34.0)
MCHC: 33.2 g/dL (ref 30.0–36.0)
MCV: 80.2 fL (ref 78.0–100.0)
Platelets: 215 10*3/uL (ref 150–400)
RBC: 3.94 MIL/uL (ref 3.87–5.11)
RDW: 13.1 % (ref 11.5–15.5)
WBC: 10.1 10*3/uL (ref 4.0–10.5)

## 2016-07-27 LAB — ABO/RH: ABO/RH(D): O POS

## 2016-07-27 LAB — RPR: RPR Ser Ql: NONREACTIVE

## 2016-07-27 LAB — PROTEIN / CREATININE RATIO, URINE
Creatinine, Urine: 77 mg/dL
PROTEIN CREATININE RATIO: 0.18 mg/mg{creat} — AB (ref 0.00–0.15)
Total Protein, Urine: 14 mg/dL

## 2016-07-27 LAB — TYPE AND SCREEN
ABO/RH(D): O POS
ANTIBODY SCREEN: NEGATIVE

## 2016-07-27 MED ORDER — EPHEDRINE 5 MG/ML INJ: 10.0000 mg | INTRAVENOUS | Status: DC | PRN

## 2016-07-27 MED ORDER — PHENYLEPHRINE 40 MCG/ML (10ML) SYRINGE FOR IV PUSH (FOR BLOOD PRESSURE SUPPORT)
80.0000 ug | PREFILLED_SYRINGE | INTRAVENOUS | Status: DC | PRN
Start: 2016-07-27 — End: 2016-07-28
  Filled 2016-07-27: qty 5
  Filled 2016-07-27: qty 10

## 2016-07-27 MED ORDER — MISOPROSTOL 25 MCG QUARTER TABLET: 25.0000 ug | ORAL_TABLET | ORAL | Status: DC | PRN

## 2016-07-27 MED ORDER — FENTANYL 2.5 MCG/ML BUPIVACAINE 1/10 % EPIDURAL INFUSION (WH - ANES)
14.0000 mL/h | INTRAMUSCULAR | Status: DC | PRN
  Administered 2016-07-27 – 2016-07-28 (×4): 14 mL/h via EPIDURAL
  Filled 2016-07-27 (×4): qty 100

## 2016-07-27 MED ORDER — FENTANYL CITRATE (PF) 100 MCG/2ML IJ SOLN
100.0000 ug | INTRAMUSCULAR | Status: DC | PRN
  Administered 2016-07-27 (×2): 100 ug via INTRAVENOUS
  Filled 2016-07-27 (×2): qty 2

## 2016-07-27 MED ORDER — SERTRALINE HCL 50 MG PO TABS
150.0000 mg | ORAL_TABLET | Freq: Every day | ORAL | Status: DC
  Administered 2016-07-28: 150 mg via ORAL
  Filled 2016-07-27 (×2): qty 1

## 2016-07-27 MED ORDER — PHENYLEPHRINE 40 MCG/ML (10ML) SYRINGE FOR IV PUSH (FOR BLOOD PRESSURE SUPPORT): 80.0000 ug | PREFILLED_SYRINGE | INTRAVENOUS | Status: DC | PRN

## 2016-07-27 MED ORDER — FENTANYL 2.5 MCG/ML BUPIVACAINE 1/10 % EPIDURAL INFUSION (WH - ANES): 14.0000 mL/h | INTRAMUSCULAR | Status: DC | PRN

## 2016-07-27 MED ORDER — LACTATED RINGERS IV SOLN
500.0000 mL | Freq: Once | INTRAVENOUS | Status: AC
  Administered 2016-07-27: 500 mL via INTRAVENOUS

## 2016-07-27 MED ORDER — LACTATED RINGERS IV SOLN
INTRAVENOUS | Status: DC
  Administered 2016-07-27 – 2016-07-28 (×3): via INTRAVENOUS

## 2016-07-27 MED ORDER — OXYCODONE-ACETAMINOPHEN 5-325 MG PO TABS: 1.0000 | ORAL_TABLET | ORAL | Status: DC | PRN

## 2016-07-27 MED ORDER — DIPHENHYDRAMINE HCL 50 MG/ML IJ SOLN: 12.5000 mg | INTRAMUSCULAR | Status: DC | PRN

## 2016-07-27 MED ORDER — LACTATED RINGERS IV SOLN: 500.0000 mL | Freq: Once | INTRAVENOUS | Status: DC

## 2016-07-27 MED ORDER — LIDOCAINE HCL (PF) 1 % IJ SOLN
INTRAMUSCULAR | Status: DC | PRN
  Administered 2016-07-27 (×2): 5 mL

## 2016-07-27 MED ORDER — TERBUTALINE SULFATE 1 MG/ML IJ SOLN: 0.2500 mg | Freq: Once | INTRAMUSCULAR | Status: DC | PRN

## 2016-07-27 MED ORDER — ACETAMINOPHEN 325 MG PO TABS
650.0000 mg | ORAL_TABLET | ORAL | Status: DC | PRN
  Administered 2016-07-27 – 2016-07-28 (×2): 650 mg via ORAL
  Filled 2016-07-27 (×2): qty 2

## 2016-07-27 MED ORDER — ONDANSETRON HCL 4 MG/2ML IJ SOLN
4.0000 mg | Freq: Four times a day (QID) | INTRAMUSCULAR | Status: DC | PRN
  Administered 2016-07-27 – 2016-07-28 (×2): 4 mg via INTRAVENOUS
  Filled 2016-07-27 (×2): qty 2

## 2016-07-27 MED ORDER — OXYCODONE-ACETAMINOPHEN 5-325 MG PO TABS: 2.0000 | ORAL_TABLET | ORAL | Status: DC | PRN

## 2016-07-27 MED ORDER — EPHEDRINE 5 MG/ML INJ
10.0000 mg | INTRAVENOUS | Status: DC | PRN
Start: 2016-07-27 — End: 2016-07-28
  Filled 2016-07-27: qty 4

## 2016-07-27 MED ORDER — TERBUTALINE SULFATE 1 MG/ML IJ SOLN
0.2500 mg | Freq: Once | INTRAMUSCULAR | Status: DC | PRN
Start: 2016-07-27 — End: 2016-07-28

## 2016-07-27 MED ORDER — OXYTOCIN BOLUS FROM INFUSION
500.0000 mL | Freq: Once | INTRAVENOUS | Status: AC
  Administered 2016-07-28: 500 mL via INTRAVENOUS

## 2016-07-27 MED ORDER — LACTATED RINGERS IV SOLN
500.0000 mL | INTRAVENOUS | Status: DC | PRN
  Administered 2016-07-28: 500 mL via INTRAVENOUS

## 2016-07-27 MED ORDER — PHENYLEPHRINE 40 MCG/ML (10ML) SYRINGE FOR IV PUSH (FOR BLOOD PRESSURE SUPPORT)
80.0000 ug | PREFILLED_SYRINGE | INTRAVENOUS | Status: DC | PRN
Start: 2016-07-27 — End: 2016-07-28
  Filled 2016-07-27: qty 5

## 2016-07-27 MED ORDER — OXYTOCIN 40 UNITS IN LACTATED RINGERS INFUSION - SIMPLE MED
2.5000 [IU]/h | INTRAVENOUS | Status: DC
  Filled 2016-07-27: qty 1000

## 2016-07-27 MED ORDER — EPHEDRINE 5 MG/ML INJ
10.0000 mg | INTRAVENOUS | Status: DC | PRN
  Filled 2016-07-27: qty 4

## 2016-07-27 MED ORDER — SOD CITRATE-CITRIC ACID 500-334 MG/5ML PO SOLN
30.0000 mL | ORAL | Status: DC | PRN
  Filled 2016-07-27: qty 15

## 2016-07-27 MED ORDER — LIDOCAINE HCL (PF) 1 % IJ SOLN
30.0000 mL | INTRAMUSCULAR | Status: DC | PRN
  Administered 2016-07-28: 30 mL via SUBCUTANEOUS
  Filled 2016-07-27: qty 30

## 2016-07-27 MED ORDER — MISOPROSTOL 200 MCG PO TABS: 50.0000 ug | ORAL_TABLET | ORAL | Status: DC | PRN

## 2016-07-27 MED ORDER — OXYTOCIN 40 UNITS IN LACTATED RINGERS INFUSION - SIMPLE MED
1.0000 m[IU]/min | INTRAVENOUS | Status: DC
  Administered 2016-07-27: 2 m[IU]/min via INTRAVENOUS

## 2016-07-27 MED ORDER — MISOPROSTOL 50MCG HALF TABLET
50.0000 ug | ORAL_TABLET | Freq: Once | ORAL | Status: AC
  Administered 2016-07-27: 50 ug via ORAL
  Filled 2016-07-27: qty 0.5

## 2016-07-27 MED ORDER — SERTRALINE HCL 50 MG PO TABS
50.0000 mg | ORAL_TABLET | Freq: Every day | ORAL | Status: DC
  Administered 2016-07-27: 50 mg via ORAL
  Filled 2016-07-27 (×2): qty 1

## 2016-07-27 MED ORDER — FENTANYL CITRATE (PF) 100 MCG/2ML IJ SOLN: 100.0000 ug | INTRAMUSCULAR | Status: DC | PRN

## 2016-07-27 MED ORDER — FLEET ENEMA 7-19 GM/118ML RE ENEM: 1.0000 | ENEMA | RECTAL | Status: DC | PRN

## 2016-07-27 NOTE — Progress Notes (Signed)
S: Patient seen & examined for progress of labor. Patient comfortable. Pain currently controlled without pain meds.  Foley bulb removed spontaneously.  O:  Vitals:   07/27/16 0900 07/27/16 1039 07/27/16 1230 07/27/16 1355  BP:      Pulse:      Resp: 18 16 18 18   Temp:  98.2 F (36.8 C) 98.3 F (36.8 C)   TempSrc:  Oral Oral   Weight:      Height:        Dilation: 3 Effacement (%): 60 Station: -3 Presentation: Vertex Exam by:: dr wallace   FHT: 130bpm, mod var, +accels, no decels TOCO: q643min   A/P: -IV Pain meds prn /epidural on request -Augment with pitocin per protocol -Continue expectant management -Anticipate SVD  Sheria LangNoah Wallace,DO PGY-3

## 2016-07-27 NOTE — Anesthesia Preprocedure Evaluation (Signed)
Anesthesia Evaluation  Patient identified by MRN, date of birth, ID band Patient awake    Reviewed: Allergy & Precautions, NPO status , Patient's Chart, lab work & pertinent test results  Airway Mallampati: II  TM Distance: >3 FB Neck ROM: Full    Dental no notable dental hx.    Pulmonary neg pulmonary ROS, former smoker,    Pulmonary exam normal        Cardiovascular negative cardio ROS Normal cardiovascular exam     Neuro/Psych PSYCHIATRIC DISORDERS Anxiety Depression Bipolar Disorder negative neurological ROS     GI/Hepatic negative GI ROS, Neg liver ROS,   Endo/Other  negative endocrine ROS  Renal/GU negative Renal ROS  negative genitourinary   Musculoskeletal negative musculoskeletal ROS (+)   Abdominal   Peds negative pediatric ROS (+)  Hematology negative hematology ROS (+)   Anesthesia Other Findings   Reproductive/Obstetrics (+) Pregnancy                             Anesthesia Physical Anesthesia Plan  ASA: II  Anesthesia Plan: Epidural   Post-op Pain Management:    Induction:   Airway Management Planned:   Additional Equipment:   Intra-op Plan:   Post-operative Plan:   Informed Consent:   Plan Discussed with:   Anesthesia Plan Comments:         Anesthesia Quick Evaluation

## 2016-07-27 NOTE — Anesthesia Pain Management Evaluation Note (Signed)
  [facility] Pain Management Visit Note  Patient: Theresa Nguyen, 20 y.o., female  "Hello I am a [facility] of the anesthesia [facility] at [facility]. We have an anesthesia team available at all times to provide care throughout the hospital, including epidural management and anesthesia for C-section. I don't know your plan for the delivery whether it a natural birth, water birth, IV sedation, nitrous supplementation, doula or epidural, but we want to meet your pain goals."   1.Was your pain managed to your expectations on prior hospitalizations?   Yes   2.What is your expectation for pain management during this hospitalization?     IV pain meds  3.How can we help you reach that goal? Interested in IV pain medications.  Record the patient's initial score and the patient's pain goal.   Pain: 8--in back area  Pain Goal: 8 [facility] wants you to be able to say your pain was always managed very well.  Rox Mcgriff L 07/27/2016
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
## 2016-07-27 NOTE — Progress Notes (Signed)
I spoke with pt's RN as well as Social Work and we will plan to follow up postpartum to offer emotional/spiritual support.  Chaplain Dyanne CarrelKaty Renold Kozar, Bcc Pager, [phone] 3:37 PM    07/27/16 1500  Clinical Encounter Type  Visited With Health care provider

## 2016-07-27 NOTE — Progress Notes (Signed)
S: Patient seen & examined for progress of labor. Patient comfortable. Denies PIH symptoms   O:  Vitals:   07/27/16 0749 07/27/16 0900  BP: (!) 132/95   Pulse: 88   Resp: 17 18  Temp: 98.2 F (36.8 C)   TempSrc: Oral   Weight: 168 lb (76.2 kg)   Height: 5\' 2"  (1.575 m)     Dilation: 1 Effacement (%): 50 Station: -2 Presentation: Vertex Exam by:: Tahari Clabaugh   FHT: 130bpm, mod var, +accels, no decels TOCO: q263min   A/P: IOL with foley bulb and cytotec  IV pain meds/epidural prn active labor Continue expectant management Anticipate SVD  Deforest HoylesNoah Starling Christofferson, DO PGY-3

## 2016-07-27 NOTE — Progress Notes (Signed)
Labor Progress Note  Theresa Nguyen is a 20 y.o. G3P1011 at 4155w2d  admitted for induction of labor due to Hypertension.  S: Patient feeling much more comfortable after epidural. Cannot feel contractions at this time.    O:  BP 122/89   Pulse (!) 57   Temp 97.9 F (36.6 C) (Oral)   Resp 16   Ht 5\' 2"  (1.575 m)   Wt 76.2 kg (168 lb)   LMP 11/09/2015   SpO2 100%   BMI 30.73 kg/m   No intake/output data recorded.  FHT:  FHR: 115 bpm, variability: moderate,  accelerations:  Present,  decelerations:  Absent UC:   mild, regular, every 1-4 minutes SVE:   Dilation: 4 Effacement (%): 70 Station: -2 Exam by:: Laural RoesB. Parks, RN  Pitocin @ 8 mu/min  Labs: Lab Results  Component Value Date   WBC 10.1 07/27/2016   HGB 10.5 (L) 07/27/2016   HCT 31.6 (L) 07/27/2016   MCV 80.2 07/27/2016   PLT 215 07/27/2016    Assessment / Plan: 20 y.o. G3P1011 1955w2d in early labor Induction of labor due to gestational hypertension,  progressing well on pitocin  Labor: Progressing on Pitocin, will continue to increase then AROM Fetal Wellbeing:  Category I Pain Control:  Epidural Anticipated MOD:  NSVD  Expectant management  Dani GobbleHillary Earnestine Shipp, MD [facility], PGY-2

## 2016-07-27 NOTE — Anesthesia Procedure Notes (Signed)
Epidural Patient location during procedure: OB Start time: 07/27/2016 8:00 PM End time: 07/27/2016 8:08 PM  Staffing Anesthesiologist: Bonita QuinGUIDETTI, Theresa Pederson S Performed: anesthesiologist   Preanesthetic Checklist Completed: patient identified, site marked, surgical consent, pre-op evaluation, timeout performed, IV checked, risks and benefits discussed and monitors and equipment checked  Epidural Patient position: sitting Prep: site prepped and draped and DuraPrep Patient monitoring: continuous pulse ox, blood pressure, heart rate and cardiac monitor Approach: midline Location: L4-L5 Injection technique: LOR saline  Needle:  Needle type: Tuohy  Needle gauge: 17 G Needle length: 9 cm and 9 Needle insertion depth: 5 cm cm Catheter type: closed end flexible Catheter size: 19 Gauge Catheter at skin depth: 10 cm Test dose: negative  Assessment Events: blood not aspirated, injection not painful, no injection resistance, negative IV test and no paresthesia
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
## 2016-07-28 DIAGNOSIS — Z3A37 37 weeks gestation of pregnancy: Secondary | ICD-10-CM

## 2016-07-28 DIAGNOSIS — O139 Gestational [pregnancy-induced] hypertension without significant proteinuria, unspecified trimester: Secondary | ICD-10-CM | POA: Diagnosis present

## 2016-07-28 DIAGNOSIS — O134 Gestational [pregnancy-induced] hypertension without significant proteinuria, complicating childbirth: Secondary | ICD-10-CM

## 2016-07-28 LAB — COMPREHENSIVE METABOLIC PANEL
ALBUMIN: 2.7 g/dL — AB (ref 3.5–5.0)
ALK PHOS: 155 U/L — AB (ref 38–126)
ALT: 9 U/L — ABNORMAL LOW (ref 14–54)
ANION GAP: 8 (ref 5–15)
AST: 23 U/L (ref 15–41)
BILIRUBIN TOTAL: 0.9 mg/dL (ref 0.3–1.2)
BUN: 7 mg/dL (ref 6–20)
CALCIUM: 8.4 mg/dL — AB (ref 8.9–10.3)
CO2: 21 mmol/L — ABNORMAL LOW (ref 22–32)
Chloride: 105 mmol/L (ref 101–111)
Creatinine, Ser: 0.87 mg/dL (ref 0.44–1.00)
GFR calc non Af Amer: >60 mL/min (ref >60)
GLUCOSE: 88 mg/dL (ref 65–99)
Potassium: 3.9 mmol/L (ref 3.5–5.1)
Sodium: 134 mmol/L — ABNORMAL LOW (ref 135–145)
TOTAL PROTEIN: 6.4 g/dL — AB (ref 6.5–8.1)

## 2016-07-28 LAB — CBC
HEMATOCRIT: 35.1 % — AB (ref 36.0–46.0)
HEMOGLOBIN: 11.6 g/dL — AB (ref 12.0–15.0)
MCH: 26.2 pg (ref 26.0–34.0)
MCHC: 33 g/dL (ref 30.0–36.0)
MCV: 79.4 fL (ref 78.0–100.0)
Platelets: 205 10*3/uL (ref 150–400)
RBC: 4.42 MIL/uL (ref 3.87–5.11)
RDW: 13 % (ref 11.5–15.5)
WBC: 20.5 10*3/uL — ABNORMAL HIGH (ref 4.0–10.5)

## 2016-07-28 MED ORDER — OXYCODONE HCL 5 MG PO TABS
10.0000 mg | ORAL_TABLET | ORAL | Status: DC | PRN
Start: 2016-07-28 — End: 2016-07-30
  Administered 2016-07-28 – 2016-07-30 (×4): 10 mg via ORAL
  Filled 2016-07-28 (×4): qty 2

## 2016-07-28 MED ORDER — ONDANSETRON HCL 4 MG/2ML IJ SOLN: 4.0000 mg | Freq: Four times a day (QID) | INTRAMUSCULAR | Status: DC | PRN

## 2016-07-28 MED ORDER — OXYCODONE HCL 5 MG PO TABS
5.0000 mg | ORAL_TABLET | ORAL | Status: DC | PRN
  Filled 2016-07-28: qty 1

## 2016-07-28 MED ORDER — SIMETHICONE 80 MG PO CHEW: 80.0000 mg | CHEWABLE_TABLET | ORAL | Status: DC | PRN

## 2016-07-28 MED ORDER — LACTATED RINGERS IV SOLN: INTRAVENOUS | Status: DC

## 2016-07-28 MED ORDER — ONDANSETRON HCL 4 MG PO TABS
4.0000 mg | ORAL_TABLET | ORAL | Status: DC | PRN
  Administered 2016-07-29: 4 mg via ORAL
  Filled 2016-07-28: qty 1

## 2016-07-28 MED ORDER — BENZOCAINE-MENTHOL 20-0.5 % EX AERO
1.0000 "application " | INHALATION_SPRAY | CUTANEOUS | Status: DC | PRN
  Administered 2016-07-29: 1 via TOPICAL
  Filled 2016-07-28 (×2): qty 56

## 2016-07-28 MED ORDER — SOD CITRATE-CITRIC ACID 500-334 MG/5ML PO SOLN: 30.0000 mL | ORAL | Status: DC | PRN

## 2016-07-28 MED ORDER — TETANUS-DIPHTH-ACELL PERTUSSIS 5-2.5-18.5 LF-MCG/0.5 IM SUSP: 0.5000 mL | Freq: Once | INTRAMUSCULAR | Status: DC

## 2016-07-28 MED ORDER — ONDANSETRON HCL 4 MG/2ML IJ SOLN: 4.0000 mg | INTRAMUSCULAR | Status: DC | PRN

## 2016-07-28 MED ORDER — BISACODYL 10 MG RE SUPP: 10.0000 mg | Freq: Every day | RECTAL | Status: DC | PRN

## 2016-07-28 MED ORDER — ZOLPIDEM TARTRATE 5 MG PO TABS: 5.0000 mg | ORAL_TABLET | Freq: Every evening | ORAL | Status: DC | PRN

## 2016-07-28 MED ORDER — CEFAZOLIN SODIUM-DEXTROSE 2-4 GM/100ML-% IV SOLN: 2.0000 g | Freq: Once | INTRAVENOUS | Status: DC

## 2016-07-28 MED ORDER — SERTRALINE HCL 50 MG PO TABS
50.0000 mg | ORAL_TABLET | Freq: Every day | ORAL | Status: DC
  Administered 2016-07-29 – 2016-07-30 (×2): 50 mg via ORAL
  Filled 2016-07-28 (×3): qty 1

## 2016-07-28 MED ORDER — DIBUCAINE 1 % RE OINT: 1.0000 "application " | TOPICAL_OINTMENT | RECTAL | Status: DC | PRN

## 2016-07-28 MED ORDER — COCONUT OIL OIL: 1.0000 "application " | TOPICAL_OIL | Status: DC | PRN

## 2016-07-28 MED ORDER — FERROUS SULFATE 325 (65 FE) MG PO TABS
325.0000 mg | ORAL_TABLET | Freq: Two times a day (BID) | ORAL | Status: DC
  Administered 2016-07-29 – 2016-07-30 (×3): 325 mg via ORAL
  Filled 2016-07-28 (×3): qty 1

## 2016-07-28 MED ORDER — OXYTOCIN BOLUS FROM INFUSION: 500.0000 mL | Freq: Once | INTRAVENOUS | Status: DC

## 2016-07-28 MED ORDER — IBUPROFEN 600 MG PO TABS
600.0000 mg | ORAL_TABLET | Freq: Four times a day (QID) | ORAL | Status: DC
  Administered 2016-07-29 – 2016-07-30 (×6): 600 mg via ORAL
  Filled 2016-07-28 (×6): qty 1

## 2016-07-28 MED ORDER — DOCUSATE SODIUM 100 MG PO CAPS
100.0000 mg | ORAL_CAPSULE | Freq: Two times a day (BID) | ORAL | Status: DC
  Administered 2016-07-28 – 2016-07-30 (×4): 100 mg via ORAL
  Filled 2016-07-28 (×4): qty 1

## 2016-07-28 MED ORDER — METHYLERGONOVINE MALEATE 0.2 MG PO TABS: 0.2000 mg | ORAL_TABLET | ORAL | Status: DC | PRN

## 2016-07-28 MED ORDER — LACTATED RINGERS IV SOLN: 500.0000 mL | INTRAVENOUS | Status: DC | PRN

## 2016-07-28 MED ORDER — ACETAMINOPHEN 325 MG PO TABS: 650.0000 mg | ORAL_TABLET | ORAL | Status: DC | PRN

## 2016-07-28 MED ORDER — LIDOCAINE HCL (PF) 1 % IJ SOLN: 30.0000 mL | INTRAMUSCULAR | Status: DC | PRN

## 2016-07-28 MED ORDER — WITCH HAZEL-GLYCERIN EX PADS: 1.0000 "application " | MEDICATED_PAD | CUTANEOUS | Status: DC | PRN

## 2016-07-28 MED ORDER — OXYTOCIN 40 UNITS IN LACTATED RINGERS INFUSION - SIMPLE MED: 2.5000 [IU]/h | INTRAVENOUS | Status: DC

## 2016-07-28 MED ORDER — PRENATAL MULTIVITAMIN CH
1.0000 | ORAL_TABLET | Freq: Every day | ORAL | Status: DC
  Administered 2016-07-28 – 2016-07-30 (×3): 1 via ORAL
  Filled 2016-07-28 (×3): qty 1

## 2016-07-28 MED ORDER — METHYLERGONOVINE MALEATE 0.2 MG/ML IJ SOLN: 0.2000 mg | INTRAMUSCULAR | Status: DC | PRN

## 2016-07-28 MED ORDER — DIPHENHYDRAMINE HCL 25 MG PO CAPS: 25.0000 mg | ORAL_CAPSULE | Freq: Four times a day (QID) | ORAL | Status: DC | PRN

## 2016-07-28 MED ORDER — FLEET ENEMA 7-19 GM/118ML RE ENEM: 1.0000 | ENEMA | Freq: Every day | RECTAL | Status: DC | PRN

## 2016-07-28 MED ORDER — MEASLES, MUMPS & RUBELLA VAC ~~LOC~~ INJ
0.5000 mL | INJECTION | Freq: Once | SUBCUTANEOUS | Status: DC
  Filled 2016-07-28: qty 0.5

## 2016-07-28 NOTE — Progress Notes (Signed)
   Conni SlipperJulie Tilley is a 20 y.o. G3P1011 at 3614w3d  admitted for induction of labor due to Hypertension.  Subjective: comfortable Objective: Vitals:   07/28/16 0901 07/28/16 0902 07/28/16 0932 07/28/16 1002  BP:  (!) 128/92 (!) 138/103 (!) 134/103  Pulse:  87 89 89  Resp: 18  18 18   Temp: 98.3 F (36.8 C)     TempSrc: Oral     SpO2:      Weight:      Height:       Total I/O In: -  Out: 725 [Urine:725]  FHT:  FHR: 145 bpm, variability: moderate,  accelerations:  Present,  decelerations:  Absent UC:   regular, every 3 minutes SVE:   Dilation: 7 Effacement (%): 70 Station: 0, +1 Exam by:: Foye ClockS. Oklesh RN Pitocin @ 5 mu/min  Labs: Lab Results  Component Value Date   WBC 10.1 07/27/2016   HGB 10.5 (L) 07/27/2016   HCT 31.6 (L) 07/27/2016   MCV 80.2 07/27/2016   PLT 215 07/27/2016    Assessment / Plan: Induction of labor due to gestational hypertension,  progressing well on pitocin  Labor: Progressing normally Fetal Wellbeing:  Category I Pain Control:  Epidural Anticipated MOD:  NSVD  CRESENZO-DISHMAN,Dantonio Justen 07/28/2016, 10:31 AM

## 2016-07-28 NOTE — Progress Notes (Signed)
Labor Progress Note  Theresa SlipperJulie Nguyen is a 20 y.o. G3P1011 at 3675w3d  admitted for induction of labor due to Hypertension.  S: Patient without pain. Contractions not picking up well, so placed IUPC.   O:  BP (!) 149/99   Pulse 61   Temp 98.6 F (37 C) (Oral)   Resp 16   Ht 5\' 2"  (1.575 m)   Wt 76.2 kg (168 lb)   LMP 11/09/2015   SpO2 100%   BMI 30.73 kg/m   No intake/output data recorded.  FHT:  FHR: 120 bpm, variability: moderate,  accelerations:  Present,  decelerations: variables UC:   regular, every 2 minutes SVE:   Dilation: 5.5 Effacement (%): 90 Station: -2 Exam by:: Windell MomentH. Evy Lutterman, MD SROM: clear @ 0018  Pitocin stopped 0144 for decel after patient changed position to R side.  Labs: Lab Results  Component Value Date   WBC 10.1 07/27/2016   HGB 10.5 (L) 07/27/2016   HCT 31.6 (L) 07/27/2016   MCV 80.2 07/27/2016   PLT 215 07/27/2016    Assessment / Plan: 20 y.o. G3P1011 275w3d in active labor Induction of labor due to gestational hypertension,  progressing on pitocin but needing down-titration for variables  Labor: Progressing on Pitocin, will restart once FHT remains stable with new maternal position Fetal Wellbeing:  Category I Pain Control:  Epidural Anticipated MOD:  NSVD  Expectant management  Dani GobbleHillary Roderica Cathell, MD [facility], PGY-2

## 2016-07-28 NOTE — Anesthesia Postprocedure Evaluation (Signed)
Anesthesia Post Note  Patient: Theresa Nguyen  Procedure(s) Performed: * No procedures listed *  Patient location during evaluation: Mother Baby Anesthesia Type: Epidural Level of consciousness: awake and alert Pain management: satisfactory to patient Vital Signs Assessment: post-procedure vital signs reviewed and stable Respiratory status: respiratory function stable Cardiovascular status: stable Postop Assessment: no headache, no backache, epidural receding, patient able to bend at knees, no signs of nausea or vomiting and adequate PO intake Anesthetic complications: no     Last Vitals:  Vitals:   07/28/16 1800 07/28/16 1940  BP: (!) 144/102 (!) 152/93  Pulse:  77  Resp:  20  Temp:      Last Pain:  Vitals:   07/28/16 1800  TempSrc:   PainSc: 0-No pain   Pain Goal: Patients Stated Pain Goal: 3 (07/28/16 1800)               Broden Holt

## 2016-07-29 LAB — CBC
HEMATOCRIT: 28 % — AB (ref 36.0–46.0)
HEMOGLOBIN: 9.5 g/dL — AB (ref 12.0–15.0)
MCH: 26.9 pg (ref 26.0–34.0)
MCHC: 33.9 g/dL (ref 30.0–36.0)
MCV: 79.3 fL (ref 78.0–100.0)
Platelets: 167 10*3/uL (ref 150–400)
RBC: 3.53 MIL/uL — AB (ref 3.87–5.11)
RDW: 13.1 % (ref 11.5–15.5)
WBC: 15.9 10*3/uL — ABNORMAL HIGH (ref 4.0–10.5)

## 2016-07-29 MED ORDER — MEASLES, MUMPS & RUBELLA VAC ~~LOC~~ INJ
0.5000 mL | INJECTION | Freq: Once | SUBCUTANEOUS | Status: DC
  Filled 2016-07-29: qty 0.5
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
## 2016-07-29 NOTE — Progress Notes (Signed)
POSTPARTUM PROGRESS NOTE  Post Partum Day 1 Subjective:  Conni SlipperJulie Tilley is a 20 y.o. G3P1011 2924w4d s/p VAVD.  No acute events overnight.  Pt denies problems with ambulating, voiding or po intake.  She denies nausea or vomiting.  Pain is moderately controlled.  She has had flatus. She has had bowel movement.  Lochia Small.   Objective: Blood pressure 120/83, pulse 74, temperature 98.1 F (36.7 C), temperature source Axillary, resp. rate 18, height 5\' 2"  (1.575 m), weight 168 lb (76.2 kg), last menstrual period 11/09/2015, SpO2 99 %.  Physical Exam:  General: alert, cooperative and no distress Lochia:normal flow Chest: no respiratory distress Heart:regular rate, distal pulses intact Abdomen: soft, nontender,  Uterine Fundus: firm, appropriately tender DVT Evaluation: No calf swelling or tenderness Extremities: no edema   Recent Labs  07/28/16 1228 07/29/16 0658  HGB 11.6* 9.5*  HCT 35.1* 28.0*    Assessment/Plan:  ASSESSMENT: Conni SlipperJulie Tilley is a 20 y.o. G3P1011 4324w4d s/p VAVD  Plan for discharge tomorrow   LOS: 2 days   Les Pouicholas SchenkMD 07/29/2016, 11:26 AM

## 2016-07-30 MED ORDER — SERTRALINE HCL 100 MG PO TABS
100.0000 mg | ORAL_TABLET | Freq: Once | ORAL | Status: AC
  Administered 2016-07-30: 100 mg via ORAL
  Filled 2016-07-30: qty 1

## 2016-07-30 MED ORDER — POLYETHYLENE GLYCOL 3350 17 GM/SCOOP PO POWD: 17.0000 g | Freq: Every day | ORAL | 99 refills | Status: AC

## 2016-07-30 MED ORDER — SERTRALINE HCL 50 MG PO TABS
150.0000 mg | ORAL_TABLET | Freq: Every day | ORAL | Status: DC
  Filled 2016-07-30: qty 1

## 2016-07-30 MED ORDER — SERTRALINE HCL 50 MG PO TABS: 150.0000 mg | ORAL_TABLET | Freq: Every day | ORAL | 2 refills | Status: DC

## 2016-07-30 MED ORDER — ZOLPIDEM TARTRATE 5 MG PO TABS: 5.0000 mg | ORAL_TABLET | Freq: Every evening | ORAL | 0 refills | Status: AC | PRN

## 2016-07-30 MED ORDER — OXYCODONE-ACETAMINOPHEN 5-325 MG PO TABS: 1.0000 | ORAL_TABLET | ORAL | 0 refills | Status: AC | PRN

## 2016-07-30 NOTE — Progress Notes (Signed)

## 2016-07-30 NOTE — Progress Notes (Signed)
Post Partum Day 2 Subjective: no complaints, up ad lib, voiding and tolerating PO  Objective: Blood pressure 123/80, pulse 66, temperature 97.9 F (36.6 C), temperature source Oral, resp. rate 18, height 5\' 2"  (1.575 m), weight 168 lb (76.2 kg), last menstrual period 11/09/2015, SpO2 97 %.  Physical Exam:  General: alert, cooperative and no distress Lochia: appropriate Uterine Fundus: firm Incision:  DVT Evaluation: No evidence of DVT seen on physical exam.   Recent Labs  07/28/16 1228 07/29/16 0658  HGB 11.6* 9.5*  HCT 35.1* 28.0*    Assessment/Plan: Evaluate for discharge later today, baby will be staying for a few days Unsure if patient will go home or stay in hotel status   LOS: 3 days   Theresa Nguyen 07/30/2016, 7:22 AM

## 2016-07-30 NOTE — Discharge Summary (Signed)
OB Discharge Summary     Patient Name: Theresa Nguyen DOB: 04-27-1996 MRN: [id number]  Date of admission: 07/27/2016 Delivering MD: Jacklyn ShellRESENZO-DISHMON, FRANCES   Date of discharge: 07/30/2016  Admitting diagnosis: 37.2wks, induction Intrauterine pregnancy: 6257w5d     Secondary diagnosis:  Active Problems:   Indication for care in labor or delivery   Gestational hypertension   Obstetrical laceration, complicated second degree  Additional problems: fetal bradycardia in second stage     Discharge diagnosis: Term Pregnancy Delivered                                    Fetal bradycardia in second stage.                                                                                              Post partum procedures:none  Augmentation: AROM and Pitocin  Complications: fetal bradycardia second stage.                           Occiput posterior position.  Hospital course:  Induction of Labor With Vaginal Delivery   20 y.o. yo G3P1011 at 5557w5d was admitted to the hospital 07/27/2016 for induction of labor.  Indication for induction: Gestational hypertension.  Patient had an  labor course as follows: Membrane Rupture Time/Date: 12:18 AM ,07/28/2016   Intrapartum Procedures: Episiotomy: None [1]                                         Lacerations:  2nd degree [3]  Patient had delivery of a Viable infant.  Information for the patient's newborn:  Theresa Nguyen, Boy Mahala [[id number]][[id number]]  Delivery Method: Vaginal, Vacuum (Extractor) (Filed from Delivery Summary)   07/28/2016  Details of delivery can be found in separate delivery note.  Patient had a routine postpartum course. Patient is discharged home 07/30/16 , on Zoloft 150 mg /d as per prior to delivery, and Ambien HS. She is to be seen in 7-10 days for perineal inspection, due to the deep irregular vulvar 2nd degree laceration which extended up each lateral pararectal sulcus but did not involve anal sphincter.  Physical exam Vitals:   07/28/16 1940 07/29/16 0856 07/29/16 2029 07/30/16 0557  BP: (!) 152/93 120/83 135/81 123/80  Pulse: 77 74 (!) 56 66  Resp: 20 18 18 18   Temp:  98.1 F (36.7 C) 98.1 F (36.7 C) 97.9 F (36.6 C)  TempSrc:  Axillary Oral Oral  SpO2: 100% 99% 100% 97%  Weight:      Height:       General: alert and cooperative Lochia: appropriate Uterine Fundus: firm Incision: Healing well with no significant drainage, Dressing is clean, dry, and intact DVT Evaluation: No evidence of DVT seen on physical exam. Labs: Lab Results  Component Value Date   WBC 15.9 (H) 07/29/2016   HGB 9.5 (L) 07/29/2016   HCT 28.0 (L) 07/29/2016   MCV  79.3 07/29/2016   PLT 167 07/29/2016   CMP Latest Ref Rng & Units 07/28/2016  Glucose 65 - 99 mg/dL 88  BUN 6 - 20 mg/dL 7  Creatinine 8.290.44 - 5.621.00 mg/dL 1.300.87  Sodium 865135 - 784145 mmol/L 134(L)  Potassium 3.5 - 5.1 mmol/L 3.9  Chloride 101 - 111 mmol/L 105  CO2 22 - 32 mmol/L 21(L)  Calcium 8.9 - 10.3 mg/dL 6.9(G8.4(L)  Total Protein 6.5 - 8.1 g/dL 6.4(L)  Total Bilirubin 0.3 - 1.2 mg/dL 0.9  Alkaline Phos 38 - 126 U/L 155(H)  AST 15 - 41 U/L 23  ALT 14 - 54 U/L 9(L)    Discharge instruction: per After Visit Summary and "Baby and Me Booklet".  After visit meds:    Medication List    TAKE these medications   oxyCODONE-acetaminophen 5-325 MG tablet Commonly known as:  PERCOCET/ROXICET Take 1-2 tablets by mouth every 4 (four) hours as needed for moderate pain or severe pain (Postoperative pain). As needed for postoperative pain. May alternate with tramadol   polyethylene glycol powder powder Commonly known as:  MIRALAX Take 17 g by mouth daily. To prevent constipation   sertraline 50 MG tablet Commonly known as:  ZOLOFT Take 3 tablets (150 mg total) by mouth daily. What changed:  how much to take   zolpidem 5 MG tablet Commonly known as:  AMBIEN Take 1 tablet (5 mg total) by mouth at bedtime as needed for sleep.       Diet: routine diet  Activity:  Advance as tolerated. Pelvic rest for 6 weeks.        Vulva check in 10 days [place] Outpatient follow up:10 d Follow up Appt:No future appointments. Follow up Visit:No Follow-up on file.  Postpartum contraception: Not Discussed  Newborn Data: Live born female  Birth Weight: 5 lb 14.4 oz (2675 g) APGAR: 7, 3  Baby Feeding: Breast Disposition:NICU   07/30/2016 Tilda BurrowFERGUSON,Theresa Tyer V, MD

## 2016-08-04 ENCOUNTER — Telehealth: Payer: Self-pay | Admitting: *Deleted

## 2016-08-04 DIAGNOSIS — F319 Bipolar disorder, unspecified: Secondary | ICD-10-CM

## 2016-08-04 DIAGNOSIS — F329 Major depressive disorder, single episode, unspecified: Secondary | ICD-10-CM

## 2016-08-04 DIAGNOSIS — F32A Depression, unspecified: Secondary | ICD-10-CM

## 2016-08-04 MED ORDER — SERTRALINE HCL 50 MG PO TABS: 150.0000 mg | ORAL_TABLET | Freq: Every day | ORAL | 2 refills | Status: AC

## 2016-08-04 NOTE — Telephone Encounter (Signed)
[facility] was changed at discharge [facility], resent rx to [facility] [place] location per patient request.

## 2016-08-04 NOTE — Telephone Encounter (Signed)
-----   Message from Olevia BowensJacinda S Battle sent at 08/04/2016  2:43 PM EST ----- Regarding: RX Request C. Clelia CroftShaw (Child psychotherapistsocial worker at [facility]) called about this patient and her Zoloft, states she went down to 50 while she was pregnant and wants to know if the patient can be prescribed 150 and [facility]

## 2016-09-08 ENCOUNTER — Ambulatory Visit: Payer: Medicaid Other | Admitting: Obstetrics and Gynecology

## 2016-09-08 NOTE — Progress Notes (Deleted)
Post Partum Exam  Theresa SlipperJulie Nguyen is a 21 y.o. 783P1011 female who presents for a postpartum visit. She is 6 weeks postpartum following a VAVD. I have fully reviewed the prenatal and intrapartum course. The delivery was at 7076w4d gestational weeks.  Anesthesia: epidural. Postpartum course has been ***. Baby's course has been ***. Baby is feeding by {breast/bottle:69}. Bleeding {vag bleed:12292}. Bowel function is {normal:32111}. Bladder function is {normal:32111}. Patient {is/is not:9024} sexually active. Contraception method is {contraceptive method:5051}. Postpartum depression screening:neg  {Common ambulatory SmartLinks:19316}  Review of Systems {ros; complete:30496}    Objective:    BP 116/78 mmHg  Pulse 78  Resp 16  Ht 5\' 5"  (1.651 m)  Wt 211 lb (95.709 kg)  BMI 35.11 kg/m2  Breastfeeding? Yes  General:  {gen appearance:16600}   Breasts:  {breast exam:1202::"inspection negative, no nipple discharge or bleeding, no masses or nodularity palpable"}  Lungs: {lung exam:16931}  Heart:  {heart exam:5510}  Abdomen: {abdomen exam:16834}   Vulva:  {labia exam:12198}  Vagina: {vagina exam:12200}  Cervix:  {cervix exam:14595}  Corpus: {uterus exam:12215}  Adnexa:  {adnexa exam:12223}  Rectal Exam: {rectal/vaginal exam:12274}        Assessment:    *** postpartum exam. Pap smear {done:10129} at today's visit.   Plan:   1. Contraception: {method:5051} 2. *** 3. Follow up in: {1-10:13787} {time; units:19136} or as needed.

## 2016-10-03 ENCOUNTER — Ambulatory Visit: Payer: Medicaid Other | Admitting: Obstetrics and Gynecology

## 2017-04-22 ENCOUNTER — Encounter (HOSPITAL_COMMUNITY): Payer: Self-pay

## 2018-05-29 ENCOUNTER — Encounter: Payer: Self-pay | Admitting: Radiology

## 2018-05-29 ENCOUNTER — Other Ambulatory Visit (INDEPENDENT_AMBULATORY_CARE_PROVIDER_SITE_OTHER): Payer: Self-pay

## 2018-05-29 VITALS — BP 121/84 | HR 72

## 2018-05-29 DIAGNOSIS — Z3201 Encounter for pregnancy test, result positive: Secondary | ICD-10-CM

## 2018-05-29 LAB — POCT URINE PREGNANCY: Preg Test, Ur: NEGATIVE

## 2018-05-29 NOTE — Progress Notes (Unsigned)
[name] presents today for UPT. She report taking five pregnancy test today and all of them had positive results.  Patient has no complaints at this time. LMP:    OBJECTIVE: Appears well, in no apparent distress.  OB History    Gravida  3   Para  1   Term  1   Preterm      AB  1   Living  1     SAB  1   TAB      Ectopic      Multiple      Live Births  1        Obstetric Comments  Early 2017: twins "5months". Pt states she passed out and then was told she had passed the babies. [place]: delivered at home. 7lbs 5oz. Lives with the patient's mother.       Home UPT Result: positive  In-Office UPT result:positive I have reviewed the patient's medical, obstetrical, social, and family histories, and medications.   ASSESSMENT: Positive pregnancy test  PLAN Prenatal care to be completed at: Will start care around 10-12 weeks. Patient has been advised to start prenatal vitamins at this time.

## 2018-06-09 ENCOUNTER — Emergency Department
Admission: EM | Admit: 2018-06-09 | Discharge: 2018-06-09 | Disposition: A | Discharge status: Home / Self Care | Payer: Self-pay | Attending: Emergency Medicine | Admitting: Emergency Medicine

## 2018-06-09 ENCOUNTER — Other Ambulatory Visit: Payer: Self-pay

## 2018-06-09 ENCOUNTER — Encounter: Payer: Self-pay | Admitting: Emergency Medicine

## 2018-06-09 ENCOUNTER — Emergency Department: Payer: Self-pay

## 2018-06-09 DIAGNOSIS — Z3A01 Less than 8 weeks gestation of pregnancy: Secondary | ICD-10-CM | POA: Insufficient documentation

## 2018-06-09 DIAGNOSIS — O209 Hemorrhage in early pregnancy, unspecified: Secondary | ICD-10-CM | POA: Insufficient documentation

## 2018-06-09 DIAGNOSIS — N3090 Cystitis, unspecified without hematuria: Secondary | ICD-10-CM | POA: Insufficient documentation

## 2018-06-09 DIAGNOSIS — Z87891 Personal history of nicotine dependence: Secondary | ICD-10-CM | POA: Insufficient documentation

## 2018-06-09 DIAGNOSIS — O200 Threatened abortion: Secondary | ICD-10-CM | POA: Insufficient documentation

## 2018-06-09 DIAGNOSIS — Z79899 Other long term (current) drug therapy: Secondary | ICD-10-CM | POA: Insufficient documentation

## 2018-06-09 DIAGNOSIS — F4310 Post-traumatic stress disorder, unspecified: Secondary | ICD-10-CM | POA: Insufficient documentation

## 2018-06-09 DIAGNOSIS — F332 Major depressive disorder, recurrent severe without psychotic features: Secondary | ICD-10-CM | POA: Insufficient documentation

## 2018-06-09 DIAGNOSIS — Z7289 Other problems related to lifestyle: Secondary | ICD-10-CM | POA: Insufficient documentation

## 2018-06-09 DIAGNOSIS — F39 Unspecified mood [affective] disorder: Secondary | ICD-10-CM | POA: Insufficient documentation

## 2018-06-09 DIAGNOSIS — Z23 Encounter for immunization: Secondary | ICD-10-CM | POA: Insufficient documentation

## 2018-06-09 DIAGNOSIS — Z915 Personal history of self-harm: Secondary | ICD-10-CM | POA: Insufficient documentation

## 2018-06-09 LAB — COMPREHENSIVE METABOLIC PANEL
ALBUMIN: 4.3 g/dL (ref 3.5–5.0)
ALT: 13 U/L (ref 0–44)
ANION GAP: 9 (ref 5–15)
AST: 20 U/L (ref 15–41)
Alkaline Phosphatase: 57 U/L (ref 38–126)
BUN: 7 mg/dL (ref 6–20)
CALCIUM: 9 mg/dL (ref 8.9–10.3)
CO2: 23 mmol/L (ref 22–32)
Chloride: 103 mmol/L (ref 98–111)
Creatinine, Ser: 0.66 mg/dL (ref 0.44–1.00)
GFR calc Af Amer: >60 mL/min (ref >60)
GFR calc non Af Amer: >60 mL/min (ref >60)
Glucose, Bld: 126 mg/dL — ABNORMAL HIGH (ref 70–99)
POTASSIUM: 3.7 mmol/L (ref 3.5–5.1)
Sodium: 135 mmol/L (ref 135–145)
TOTAL PROTEIN: 7.7 g/dL (ref 6.5–8.1)
Total Bilirubin: 1 mg/dL (ref 0.3–1.2)

## 2018-06-09 LAB — URINALYSIS, COMPLETE (UACMP) WITH MICROSCOPIC
BILIRUBIN URINE: NEGATIVE
GLUCOSE, UA: NEGATIVE mg/dL
HGB URINE DIPSTICK: NEGATIVE
Ketones, ur: 5 mg/dL — AB
NITRITE: POSITIVE — AB
Protein, ur: NEGATIVE mg/dL
SPECIFIC GRAVITY, URINE: 1.017 (ref 1.005–1.030)
pH: 6 (ref 5.0–8.0)

## 2018-06-09 LAB — CBC
HEMATOCRIT: 40.7 % (ref 35.0–47.0)
HEMOGLOBIN: 13.2 g/dL (ref 12.0–16.0)
MCH: 26 pg (ref 26.0–34.0)
MCHC: 32.5 g/dL (ref 32.0–36.0)
MCV: 80 fL (ref 80.0–100.0)
Platelets: 286 10*3/uL (ref 150–440)
RBC: 5.09 MIL/uL (ref 3.80–5.20)
RDW: 14.6 % — AB (ref 11.5–14.5)
WBC: 10 10*3/uL (ref 3.6–11.0)

## 2018-06-09 LAB — ETHANOL

## 2018-06-09 LAB — URINE DRUG SCREEN, QUALITATIVE (ARMC ONLY)
Amphetamines, Ur Screen: NOT DETECTED
BARBITURATES, UR SCREEN: NOT DETECTED
Benzodiazepine, Ur Scrn: NOT DETECTED
CANNABINOID 50 NG, UR ~~LOC~~: POSITIVE — AB
COCAINE METABOLITE, UR ~~LOC~~: NOT DETECTED
MDMA (ECSTASY) UR SCREEN: NOT DETECTED
METHADONE SCREEN, URINE: NOT DETECTED
Opiate, Ur Screen: NOT DETECTED
Phencyclidine (PCP) Ur S: NOT DETECTED
Tricyclic, Ur Screen: NOT DETECTED

## 2018-06-09 LAB — HCG, QUANTITATIVE, PREGNANCY: HCG, BETA CHAIN, QUANT, S: 47249 m[IU]/mL — AB (ref <5)

## 2018-06-09 LAB — SALICYLATE LEVEL

## 2018-06-09 LAB — LIPASE, BLOOD: LIPASE: 26 U/L (ref 11–51)

## 2018-06-09 LAB — ABO/RH: ABO/RH(D): O POS

## 2018-06-09 LAB — ACETAMINOPHEN LEVEL

## 2018-06-09 MED ORDER — TETANUS-DIPHTH-ACELL PERTUSSIS 5-2.5-18.5 LF-MCG/0.5 IM SUSP
0.5000 mL | Freq: Once | INTRAMUSCULAR | Status: AC
  Administered 2018-06-09: 0.5 mL via INTRAMUSCULAR
  Filled 2018-06-09: qty 0.5

## 2018-06-09 MED ORDER — CEPHALEXIN 500 MG PO CAPS
500.0000 mg | ORAL_CAPSULE | Freq: Two times a day (BID) | ORAL | Status: DC
  Administered 2018-06-09: 500 mg via ORAL
  Filled 2018-06-09 (×2): qty 1
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
## 2018-06-09 NOTE — ED Notes (Signed)
Pt dressed out. Pt placed all belongings in bag and gave them to her husband, Lyda Jester. EDT did not keep any belongings for safe keeping.

## 2018-06-09 NOTE — ED Notes (Signed)

## 2018-06-09 NOTE — ED Provider Notes (Signed)
Psychiatry feels she can go.  I want to confirm this with the patient she is happy and cheerful and wants to go to spend the night with her mom.  She has to go home and get her son first but she feels confident that this will be fine.  She knows to return here if she has any further problems she has follow-up with OB and psychiatry already.  She is not taking any medicines currently and psychiatry tonight did not recommend any medicines due to her pregnancy.  I will discharge her.   Arnaldo Natal, MD 06/09/18 [id number]

## 2018-06-09 NOTE — ED Notes (Signed)
Pt states started bleeding yesterday, approx 6 weeks. 1st ob visit scheduled for mid nov. Pt resting comfortably in room. vss.

## 2018-06-09 NOTE — Discharge Instructions (Addendum)
Anytime you have bleeding early in pregnancy we always call it a threatened miscarriage.  Remember the ultrasound looked good.  At this point there is nothing you did or did not do that could change the outcome of the pregnancy.  It is possible you may miscarry the child or you may end up with a normal healthy child.  There is about an equal chance of either happening.  Please return to the emergency room if you have heavier bleeding or pain.  Please continue follow-up with your OB/GYN.  Please also continue to follow-up with your therapist.  Make sure you keep your appointment on the ninth.  Please return here for any further problems.

## 2018-06-09 NOTE — ED Triage Notes (Signed)
Vaginal bleeding since yesterday, thinks is [redacted] weeks pregnant.

## 2018-06-09 NOTE — ED Notes (Signed)
Pt medically cleared and being dressed out for quad - moving to 20h

## 2018-06-09 NOTE — BHH Counselor (Signed)
TTS attempted to access patient at 12:45, however patient was not medically cleared at the time.Marland Kitchen
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
## 2018-06-09 NOTE — ED Notes (Signed)
BEHAVIORAL HEALTH ROUNDING Patient sleeping: No. Patient alert and oriented: yes Behavior appropriate: Yes.  ; If no, describe:  Nutrition and fluids offered: yes Toileting and hygiene offered: Yes  Sitter present: q15 minute observations and security  monitoring Law enforcement present: Yes  ODS  
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
## 2018-06-10 LAB — URINE CULTURE

## 2018-07-11 ENCOUNTER — Encounter: Payer: Self-pay | Admitting: Family Medicine

## 2019-02-18 IMAGING — US US OB TRANSVAGINAL
1 series · 14 of 28 positions shown · non-contrast
Comparison: None for this gestational

CLINICAL DATA: Early pregnancy, vaginal bleeding; quantitative beta
HCG = 47,249

EXAM:
OBSTETRIC <14 WK US AND TRANSVAGINAL OB US
TECHNIQUE: Both transabdominal and transvaginal ultrasound examinations were
performed for complete evaluation of the gestation as well as the
maternal uterus, adnexal regions, and pelvic cul-de-sac.
Transvaginal technique was performed to assess early pregnancy.

[Series 1: us ob transvaginal · 14 of 63 slices shown]
[im 3/63]
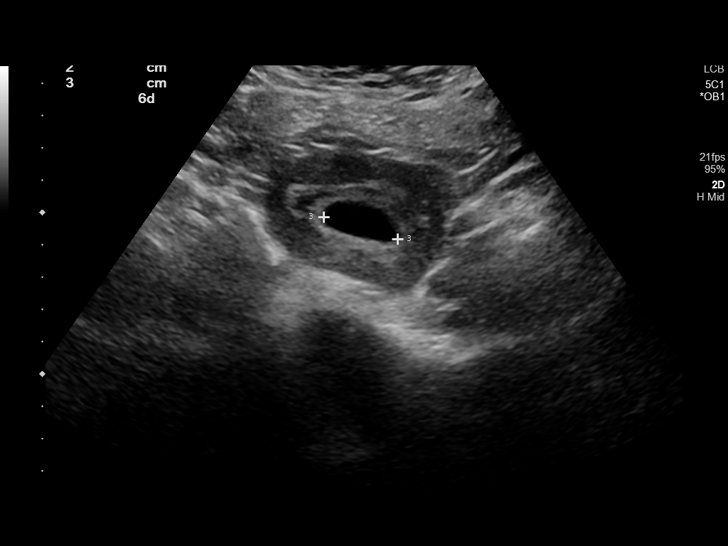
[im 7/63]
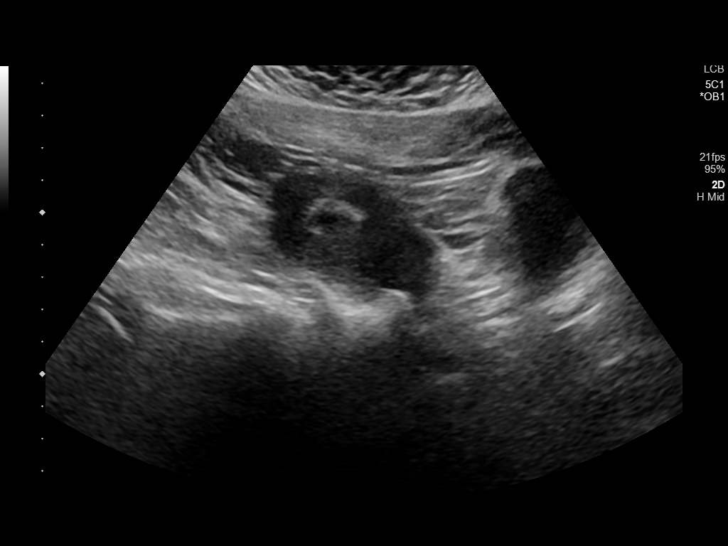
[im 12/63]
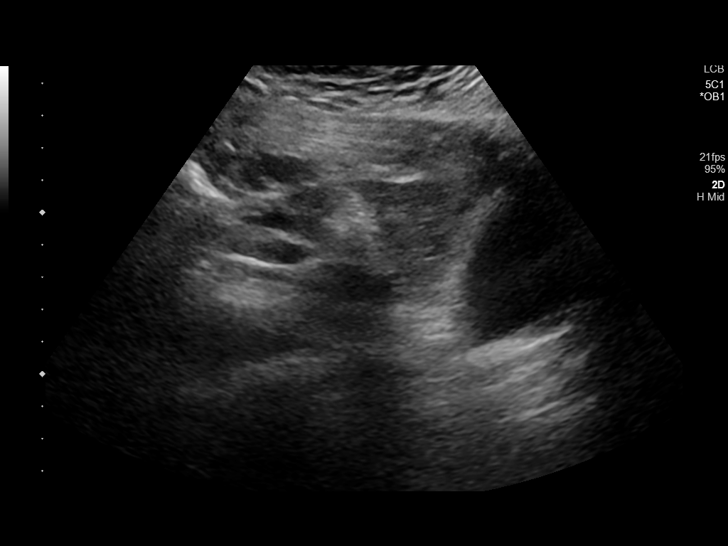
[im 17/63]
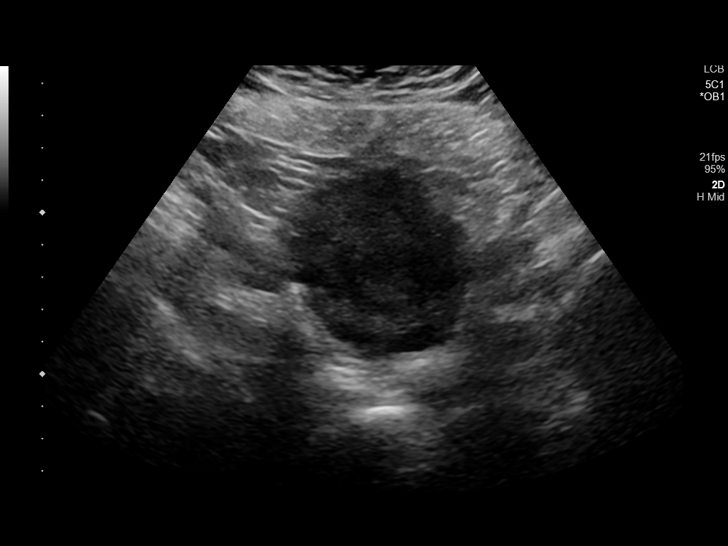
[im 21/63]
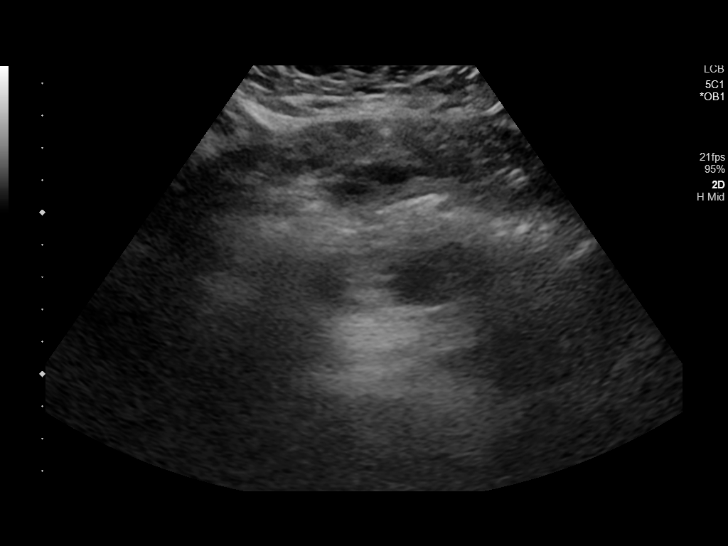
[im 26/63]
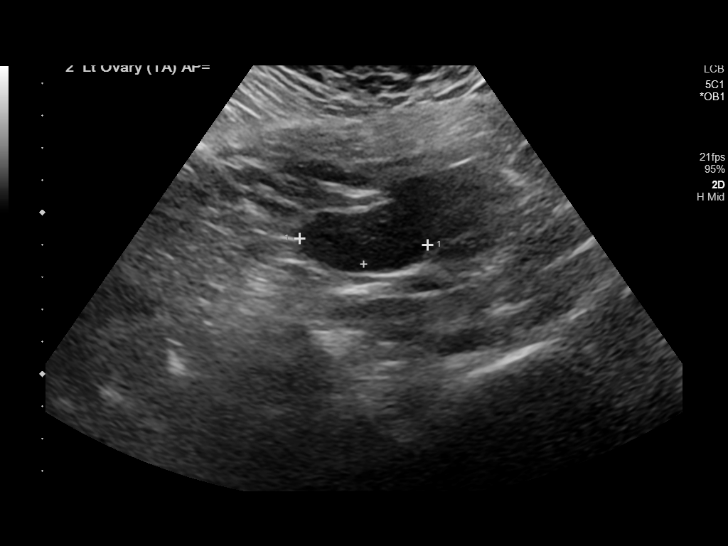
[im 30/63]
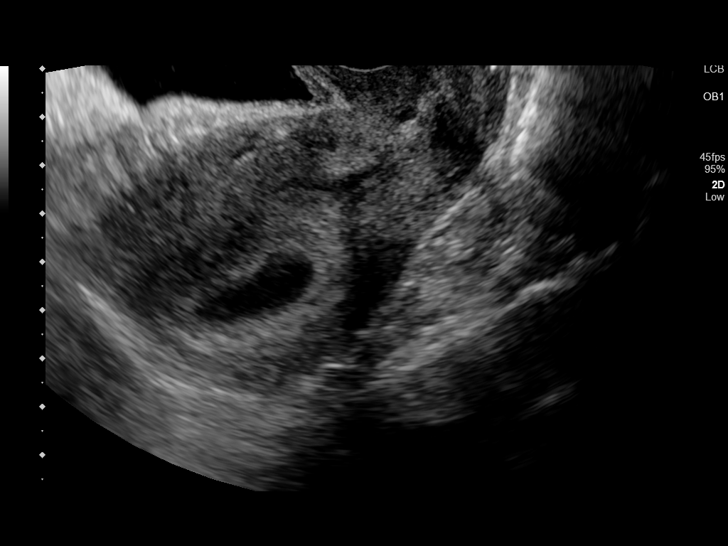
[im 35/63]
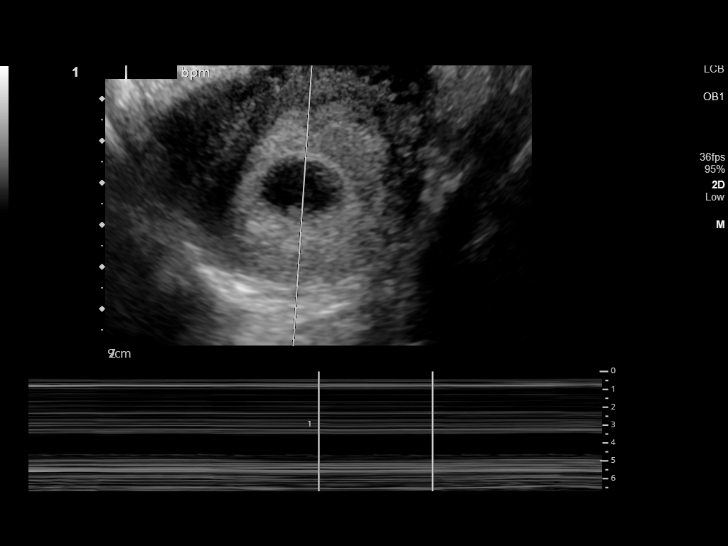
[im 40/63]
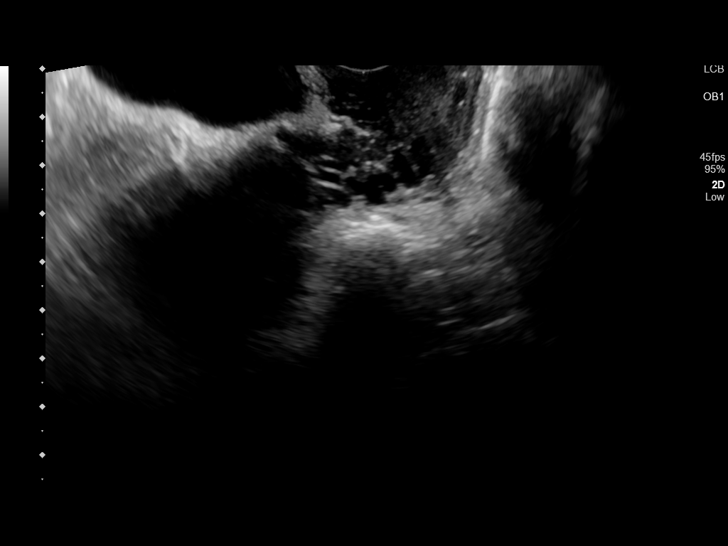
[im 44/63]
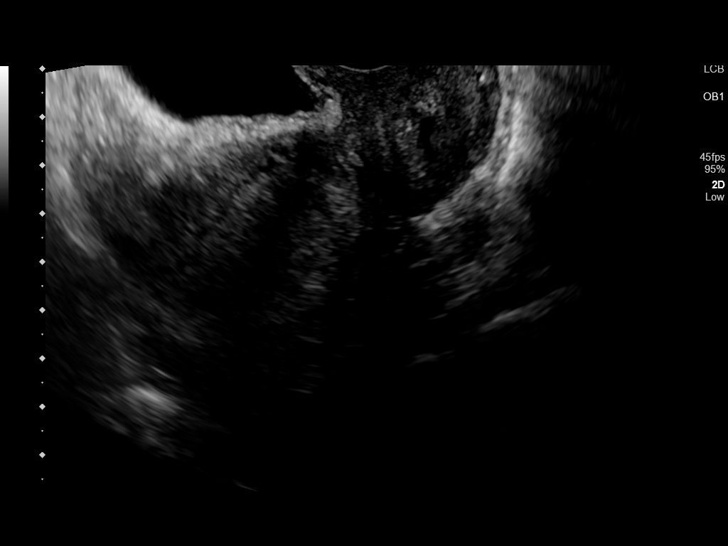
[im 49/63]
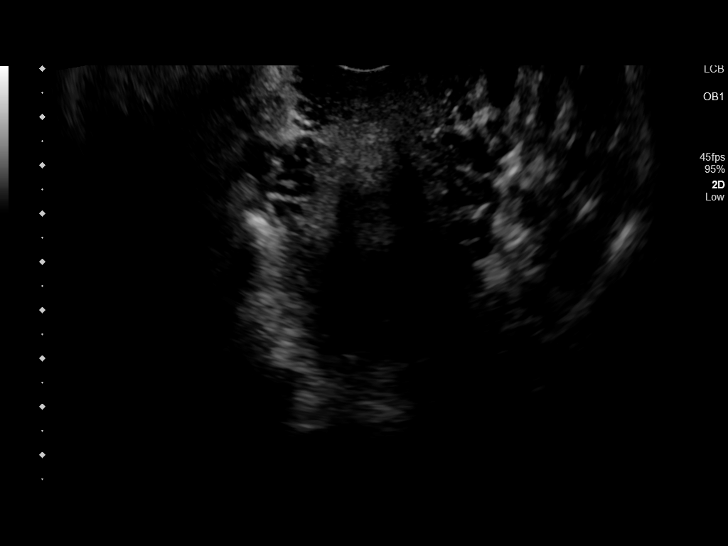
[im 53/63]
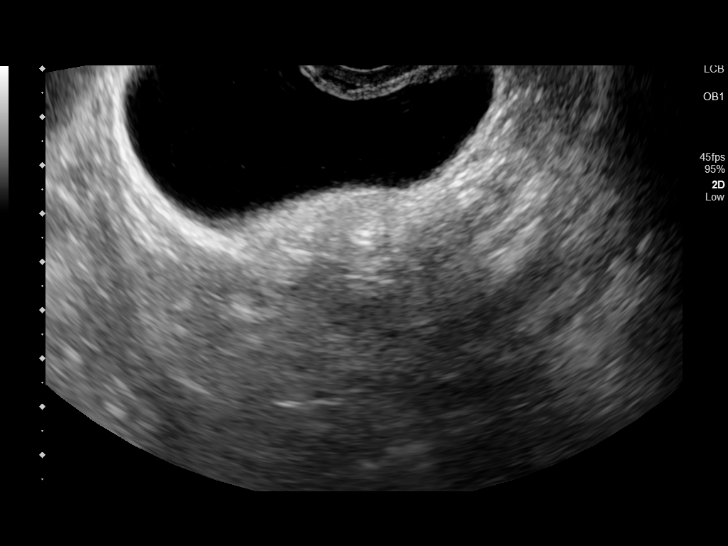
[im 58/63]
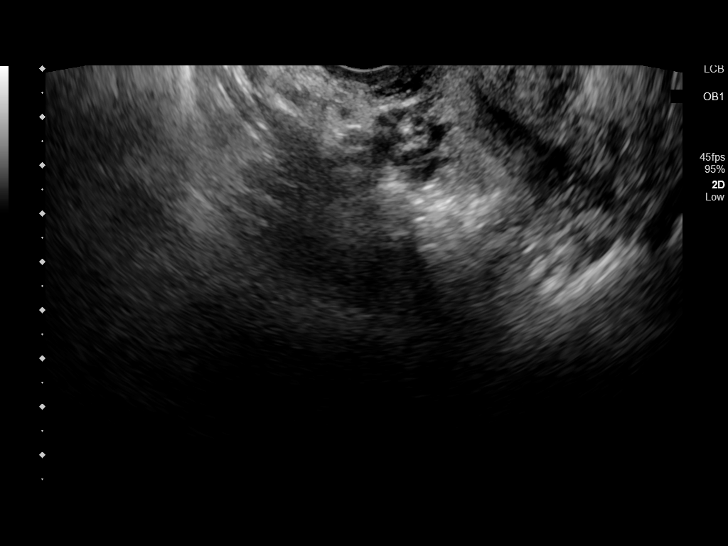
[im 63/63]
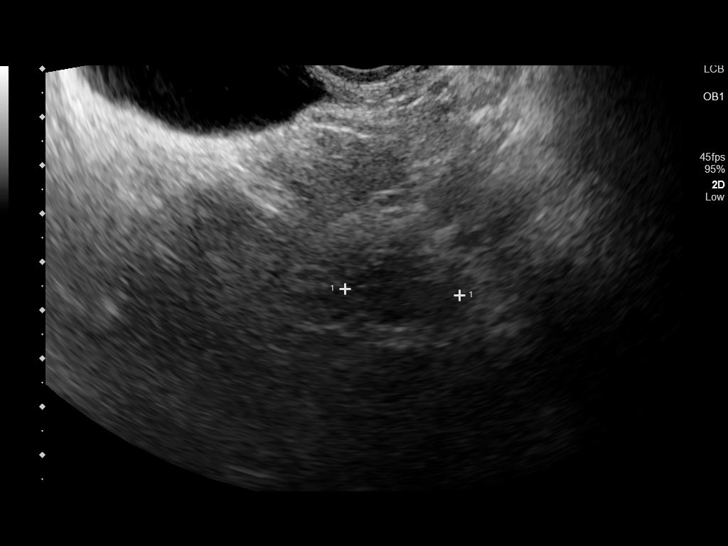

[14 of 28 positions shown; findings below may reference images not displayed]

FINDINGS: Intrauterine gestational sac: Present, single

Yolk sac:  Present

Embryo:  Present

Cardiac Activity: Present

Heart Rate: 94 bpm

CRL:  5.2 mm   6 w   2 d                  US EDC: 01/31/2019

Subchorionic hemorrhage:  None visualized.

Maternal uterus/adnexae:

LEFT ovary normal size and morphology 4.0 x 1.9 x 3.6 cm.

RIGHT ovary not visualized on either transabdominal or endovaginal
imaging likely obscured by bowel.

No free pelvic fluid or adnexal masses.
IMPRESSION: Single live intrauterine gestation at 6 weeks 2 days EGA.

No acute abnormalities.

Nonvisualization of RIGHT ovary.
